# Patient Record
Sex: Female | Born: 1970 | Race: White | Hispanic: No | Marital: Married | Smoking: Current some day smoker
Health system: Southern US, Community
[De-identification: ages and names within clinical notes are randomized; demographics above are authoritative.]

## PROBLEM LIST (undated history)

## (undated) DIAGNOSIS — G43909 Migraine, unspecified, not intractable, without status migrainosus: Secondary | ICD-10-CM

## (undated) DIAGNOSIS — E079 Disorder of thyroid, unspecified: Secondary | ICD-10-CM

## (undated) DIAGNOSIS — E039 Hypothyroidism, unspecified: Secondary | ICD-10-CM

## (undated) DIAGNOSIS — T7840XA Allergy, unspecified, initial encounter: Secondary | ICD-10-CM

## (undated) DIAGNOSIS — G4730 Sleep apnea, unspecified: Secondary | ICD-10-CM

## (undated) DIAGNOSIS — M3500 Sicca syndrome, unspecified: Secondary | ICD-10-CM

## (undated) DIAGNOSIS — J449 Chronic obstructive pulmonary disease, unspecified: Secondary | ICD-10-CM

## (undated) DIAGNOSIS — J45909 Unspecified asthma, uncomplicated: Secondary | ICD-10-CM

## (undated) DIAGNOSIS — I10 Essential (primary) hypertension: Secondary | ICD-10-CM

## (undated) DIAGNOSIS — M1990 Unspecified osteoarthritis, unspecified site: Secondary | ICD-10-CM

## (undated) DIAGNOSIS — M069 Rheumatoid arthritis, unspecified: Secondary | ICD-10-CM

## (undated) DIAGNOSIS — F329 Major depressive disorder, single episode, unspecified: Secondary | ICD-10-CM

## (undated) DIAGNOSIS — R319 Hematuria, unspecified: Secondary | ICD-10-CM

## (undated) DIAGNOSIS — M329 Systemic lupus erythematosus, unspecified: Secondary | ICD-10-CM

## (undated) DIAGNOSIS — Z903 Acquired absence of stomach [part of]: Secondary | ICD-10-CM

## (undated) DIAGNOSIS — K219 Gastro-esophageal reflux disease without esophagitis: Secondary | ICD-10-CM

## (undated) DIAGNOSIS — F32A Depression, unspecified: Secondary | ICD-10-CM

## (undated) DIAGNOSIS — E890 Postprocedural hypothyroidism: Secondary | ICD-10-CM

## (undated) DIAGNOSIS — R7611 Nonspecific reaction to tuberculin skin test without active tuberculosis: Secondary | ICD-10-CM

## (undated) DIAGNOSIS — IMO0002 Reserved for concepts with insufficient information to code with codable children: Secondary | ICD-10-CM

## (undated) DIAGNOSIS — Z8669 Personal history of other diseases of the nervous system and sense organs: Secondary | ICD-10-CM

## (undated) HISTORY — PX: BREAST CYST ASPIRATION: SHX578

## (undated) HISTORY — DX: Unspecified asthma, uncomplicated: J45.909

## (undated) HISTORY — DX: Allergy, unspecified, initial encounter: T78.40XA

## (undated) HISTORY — DX: Depression, unspecified: F32.A

## (undated) HISTORY — DX: Chronic obstructive pulmonary disease, unspecified: J44.9

## (undated) HISTORY — DX: Major depressive disorder, single episode, unspecified: F32.9

## (undated) HISTORY — DX: Hematuria, unspecified: R31.9

## (undated) HISTORY — DX: Nonspecific reaction to tuberculin skin test without active tuberculosis: R76.11

## (undated) HISTORY — DX: Disorder of thyroid, unspecified: E07.9

## (undated) HISTORY — DX: Essential (primary) hypertension: I10

## (undated) HISTORY — DX: Migraine, unspecified, not intractable, without status migrainosus: G43.909

## (undated) HISTORY — DX: Gastro-esophageal reflux disease without esophagitis: K21.9

## (undated) HISTORY — PX: OOPHORECTOMY: SHX86

## (undated) HISTORY — DX: Sleep apnea, unspecified: G47.30

---

## 1988-08-05 HISTORY — PX: CHOLECYSTECTOMY: SHX55

## 1998-08-05 HISTORY — PX: TUBAL LIGATION: SHX77

## 2003-08-06 HISTORY — PX: OTHER SURGICAL HISTORY: SHX169

## 2005-09-16 ENCOUNTER — Ambulatory Visit: Payer: Self-pay

## 2006-10-02 ENCOUNTER — Ambulatory Visit: Payer: Self-pay | Admitting: Family Medicine

## 2007-06-11 ENCOUNTER — Ambulatory Visit: Payer: Self-pay

## 2007-08-06 HISTORY — PX: ANKLE SURGERY: SHX546

## 2007-08-17 ENCOUNTER — Ambulatory Visit: Payer: Self-pay | Admitting: Podiatry

## 2007-12-24 ENCOUNTER — Ambulatory Visit: Payer: Self-pay | Admitting: Podiatry

## 2009-02-01 ENCOUNTER — Other Ambulatory Visit: Payer: Self-pay

## 2009-02-16 ENCOUNTER — Ambulatory Visit: Payer: Self-pay

## 2009-02-24 ENCOUNTER — Ambulatory Visit: Payer: Self-pay

## 2009-08-21 ENCOUNTER — Ambulatory Visit: Payer: Self-pay | Admitting: Family Medicine

## 2010-01-30 ENCOUNTER — Ambulatory Visit: Payer: Self-pay

## 2010-08-04 ENCOUNTER — Ambulatory Visit: Payer: Self-pay | Admitting: Internal Medicine

## 2011-01-31 ENCOUNTER — Ambulatory Visit: Payer: Self-pay | Admitting: Obstetrics and Gynecology

## 2011-05-07 ENCOUNTER — Ambulatory Visit: Payer: Self-pay | Admitting: Internal Medicine

## 2011-05-09 ENCOUNTER — Ambulatory Visit: Payer: Self-pay | Admitting: Internal Medicine

## 2011-05-19 DIAGNOSIS — E890 Postprocedural hypothyroidism: Secondary | ICD-10-CM | POA: Insufficient documentation

## 2011-07-25 ENCOUNTER — Ambulatory Visit: Payer: Self-pay | Admitting: General Practice

## 2011-08-04 ENCOUNTER — Ambulatory Visit: Payer: Self-pay

## 2011-08-06 HISTORY — PX: BILATERAL SALPINGOOPHORECTOMY: SHX1223

## 2011-08-06 HISTORY — PX: BREAST SURGERY: SHX581

## 2011-08-06 HISTORY — PX: ABDOMINAL HYSTERECTOMY: SHX81

## 2011-09-27 ENCOUNTER — Ambulatory Visit: Payer: Self-pay | Admitting: General Practice

## 2012-01-30 ENCOUNTER — Ambulatory Visit: Payer: Self-pay | Admitting: General Practice

## 2012-07-15 ENCOUNTER — Ambulatory Visit: Payer: Self-pay | Admitting: Obstetrics and Gynecology

## 2012-07-20 ENCOUNTER — Ambulatory Visit: Payer: Self-pay | Admitting: Obstetrics and Gynecology

## 2012-07-20 LAB — BASIC METABOLIC PANEL
Calcium, Total: 8.7 mg/dL (ref 8.5–10.1)
Creatinine: 0.53 mg/dL — ABNORMAL LOW (ref 0.60–1.30)
EGFR (African American): >60
EGFR (Non-African Amer.): >60
Glucose: 100 mg/dL — ABNORMAL HIGH (ref 65–99)
Potassium: 3.6 mmol/L (ref 3.5–5.1)
Sodium: 137 mmol/L (ref 136–145)

## 2012-07-20 LAB — CBC
HGB: 12.9 g/dL (ref 12.0–16.0)
MCH: 29.7 pg (ref 26.0–34.0)
MCV: 86 fL (ref 80–100)
RBC: 4.34 10*6/uL (ref 3.80–5.20)

## 2012-07-20 LAB — PREGNANCY, URINE: Pregnancy Test, Urine: NEGATIVE m[IU]/mL

## 2012-07-22 ENCOUNTER — Ambulatory Visit: Payer: Self-pay | Admitting: Obstetrics and Gynecology

## 2012-07-23 LAB — CREATININE, SERUM
Creatinine: 0.52 mg/dL — ABNORMAL LOW (ref 0.60–1.30)
EGFR (African American): >60

## 2012-07-24 LAB — PATHOLOGY REPORT

## 2012-09-08 ENCOUNTER — Ambulatory Visit: Payer: Self-pay | Admitting: General Practice

## 2012-09-08 LAB — COMPREHENSIVE METABOLIC PANEL
Albumin: 4.1 g/dL (ref 3.4–5.0)
Alkaline Phosphatase: 103 U/L (ref 50–136)
Anion Gap: 9 (ref 7–16)
Bilirubin,Total: 0.3 mg/dL (ref 0.2–1.0)
Chloride: 102 mmol/L (ref 98–107)
Creatinine: 0.52 mg/dL — ABNORMAL LOW (ref 0.60–1.30)
EGFR (African American): >60
EGFR (Non-African Amer.): >60
Glucose: 85 mg/dL (ref 65–99)
Sodium: 137 mmol/L (ref 136–145)
Total Protein: 8.8 g/dL — ABNORMAL HIGH (ref 6.4–8.2)

## 2012-09-08 LAB — CBC WITH DIFFERENTIAL/PLATELET
Basophil #: 0.1 10*3/uL (ref 0.0–0.1)
Eosinophil #: 0.1 10*3/uL (ref 0.0–0.7)
Eosinophil %: 1.3 %
HGB: 13.7 g/dL (ref 12.0–16.0)
Lymphocyte #: 2.9 10*3/uL (ref 1.0–3.6)
MCH: 29.4 pg (ref 26.0–34.0)
MCV: 86 fL (ref 80–100)
Monocyte %: 6.2 %
Neutrophil %: 56.7 %
RDW: 13.1 % (ref 11.5–14.5)
WBC: 8.3 10*3/uL (ref 3.6–11.0)

## 2012-09-08 LAB — TSH: Thyroid Stimulating Horm: 3.03 u[IU]/mL

## 2012-11-26 ENCOUNTER — Other Ambulatory Visit: Payer: Self-pay | Admitting: Obstetrics and Gynecology

## 2012-11-26 LAB — T4, FREE: Free Thyroxine: 1.03 ng/dL (ref 0.76–1.46)

## 2012-12-11 ENCOUNTER — Encounter: Payer: Self-pay | Admitting: Adult Health

## 2012-12-11 ENCOUNTER — Ambulatory Visit (INDEPENDENT_AMBULATORY_CARE_PROVIDER_SITE_OTHER): Payer: 59 | Admitting: Adult Health

## 2012-12-11 VITALS — BP 100/62 | HR 78 | Temp 98.0°F | Resp 14 | Ht 70.5 in | Wt 231.5 lb

## 2012-12-11 DIAGNOSIS — E0500 Thyrotoxicosis with diffuse goiter without thyrotoxic crisis or storm: Secondary | ICD-10-CM

## 2012-12-11 DIAGNOSIS — G43909 Migraine, unspecified, not intractable, without status migrainosus: Secondary | ICD-10-CM

## 2012-12-11 DIAGNOSIS — Z8639 Personal history of other endocrine, nutritional and metabolic disease: Secondary | ICD-10-CM

## 2012-12-11 DIAGNOSIS — Z7189 Other specified counseling: Secondary | ICD-10-CM

## 2012-12-11 DIAGNOSIS — Z862 Personal history of diseases of the blood and blood-forming organs and certain disorders involving the immune mechanism: Secondary | ICD-10-CM

## 2012-12-11 DIAGNOSIS — F17200 Nicotine dependence, unspecified, uncomplicated: Secondary | ICD-10-CM

## 2012-12-11 DIAGNOSIS — Z716 Tobacco abuse counseling: Secondary | ICD-10-CM

## 2012-12-11 DIAGNOSIS — Z0000 Encounter for general adult medical examination without abnormal findings: Secondary | ICD-10-CM | POA: Insufficient documentation

## 2012-12-11 MED ORDER — VENLAFAXINE HCL ER 37.5 MG PO CP24: 37.5000 mg | ORAL_CAPSULE | Freq: Every day | ORAL | Status: DC

## 2012-12-11 MED ORDER — TOPIRAMATE 100 MG PO TABS: 100.0000 mg | ORAL_TABLET | Freq: Every day | ORAL | Status: DC

## 2012-12-11 NOTE — Patient Instructions (Addendum)
   Thank you for choosing [place] [facility] for your health care needs.  The results will be available through MyChart for your convenience. Please remember to activate this. The activation code is located at the end of this form.  I am referring you to our [place] Endocrinologist. The office will contact you with an appointment.  Please have your fasting labs done at [facility]. Then schedule a f/u appointment within 2-3 weeks to discuss.

## 2012-12-11 NOTE — Assessment & Plan Note (Addendum)
Has been followed by endocrine at [facility]. Due to change in insurance, patient would like referral to endocrinologist at [facility]. She is currently on Synthroid 137 mcg daily. She reports last TSH and free T4, T3 normal. Will request medical records.

## 2012-12-11 NOTE — Assessment & Plan Note (Signed)
Spent greater than 5 minutes discussing importance of smoking cessation. Patient is actively trying to cut back the amount of cigarettes that she smokes with the goal of quitting. She is aware of medication available to help with this.

## 2012-12-11 NOTE — Assessment & Plan Note (Signed)
Controlled on Topamax 100 mg daily. Continue to monitor.

## 2012-12-11 NOTE — Progress Notes (Signed)
Subjective:    Patient ID: [name], female    DOB: 1971/02/03, 42 y.o.   MRN: [id number]  HPI  Patient is a 42 y/o female who presents to clinic to establish care.  Patient was previously followed at [facility] in [facility]. Last physical was ~ 2006. She is feeling well overall.    Past Medical History  Diagnosis Date  . Asthma   . Depression   . Migraines   . GERD (gastroesophageal reflux disease)   . Allergy   . Hypertension   . Thyroid disease     Graves disease radioactive iodine 10/12  . Positive TB test     Past Surgical History  Procedure Laterality Date  . Ankle surgery  08/06/2007    left  . Ganglion cyst removal  08/06/2003    left wrist  . Tubal ligation  2000  . Abdominal hysterectomy  2013    and BSO  . Bilateral salpingoophorectomy  2013  . Breast surgery Left 2013    biopsy-benign  . Cholecystectomy  1990    Family History  Problem Relation Age of Onset  . Cancer Mother     fallopian tube  . Arthritis Mother   . Heart disease Mother   . Diabetes Mother   . Hypertension Mother   . Cancer Maternal Aunt     Breast CA  . Diabetes Maternal Grandmother   . Diabetes Maternal Grandfather   . Diabetes Paternal Grandmother   . Diabetes Paternal Grandfather     History   Social History  . Marital Status: Married    Spouse Name: N/A    Number of Children: N/A  . Years of Education: N/A   Occupational History  . Not on file.   Social History Main Topics  . Smoking status: Current Every Day Smoker -- 0.50 packs/day for 20 years    Types: Cigarettes  . Smokeless tobacco: Never Used  . Alcohol Use: No  . Drug Use: No  . Sexually Active: Yes    Birth Control/ Protection: Surgical   Other Topics Concern  . Not on file   Social History Narrative  . No narrative on file     Health Maintenance:  Tdap - 2012  Flu shot - 05/2012; Repeat 05/2013  PAP - 02/2012 All normal within 5 years. Total hysterectomy, bilateral salpingo  oophorectomy.  Mammography - 07/2012. Fibrocystic breast - [facility]. Next 07/2013.  Colonoscopy - N/A  Labs - needs routine labs: cbc w/diff, cmet, lipids. TSH recently done at Endocrinology office.  Depression Screen - hx of depression. Well controlled on Effexor. No feelings of sadness, hopelessness or anhedonia.  Tobacco Use - Current smoker 1/2 ppd. 20 year smoker.  Dental Exams - Upper dentures. Sees dentist every 6 months. Last exam 11/2012.  Vision Exam - 09/2012  Exercise - Walks 3-4 times a week.  Diet - Does not like vegetables. She will eat tomatoes and zucchini. Eats fruits. Not a meat eater. Will eat chicken.    Review of Systems  Constitutional: Positive for fatigue. Negative for fever and chills.  Eyes: Negative.   Respiratory: Negative.   Cardiovascular: Negative for chest pain, palpitations and leg swelling.  Gastrointestinal: Positive for diarrhea and constipation.  Endocrine: Positive for heat intolerance.  Genitourinary: Negative for dysuria, urgency, frequency, hematuria, vaginal bleeding, vaginal discharge, difficulty urinating, pelvic pain and dyspareunia.  Musculoskeletal: Positive for arthralgias.       Mainly at night-time  Skin: Negative.   Allergic/Immunologic: Positive  for environmental allergies. Negative for food allergies.  Neurological: Positive for headaches. Negative for dizziness, tremors, weakness, light-headedness and numbness.       Headaches controlled with topamax  Hematological: Negative.   Psychiatric/Behavioral: Negative for suicidal ideas, behavioral problems, confusion, self-injury, decreased concentration and agitation. The patient is not nervous/anxious.     BP 100/62  Pulse 78  Temp(Src) 98 F (36.7 C) (Oral)  Resp 14  Ht 5' 10.5" (1.791 m)  Wt 231 lb 8 oz (105.008 kg)  BMI 32.74 kg/m2  SpO2 97%    Objective:   Physical Exam  Constitutional: She is oriented to person, place, and time. She appears  well-developed and well-nourished. No distress.  HENT:  Head: Normocephalic and atraumatic.  Right Ear: External ear normal.  Left Ear: External ear normal.  Nose: Nose normal.  Mouth/Throat: Oropharynx is clear and moist. No oropharyngeal exudate.  Eyes: Conjunctivae and EOM are normal. Pupils are equal, round, and reactive to light.  Neck: Normal range of motion. Neck supple. No thyromegaly present.  Cardiovascular: Normal rate, regular rhythm, normal heart sounds and intact distal pulses.  Exam reveals no gallop and no friction rub.   No murmur heard. Pulmonary/Chest: Effort normal and breath sounds normal. No respiratory distress. She has no wheezes. She has no rales. She exhibits no tenderness.  Abdominal: Soft. Bowel sounds are normal. She exhibits no distension and no mass. There is no tenderness. There is no rebound and no guarding.  Musculoskeletal: Normal range of motion. She exhibits no edema and no tenderness.  Lymphadenopathy:    She has no cervical adenopathy.  Neurological: She is alert and oriented to person, place, and time. She has normal reflexes. She displays normal reflexes. No cranial nerve deficit. She exhibits normal muscle tone. Coordination normal.  Skin: Skin is warm and dry. No rash noted. No erythema. No pallor.  Psychiatric: She has a normal mood and affect. Her behavior is normal. Judgment and thought content normal.      Assessment & Plan:

## 2012-12-11 NOTE — Assessment & Plan Note (Signed)
Normal physical exam. Patient recently had thyroid function tests so I will not obtain these. Will request medical records from previous provider. Check CBC with differential, metabolic panel, lipids. Refer to [facility] endocrinology for her history of Graves' disease.

## 2012-12-16 ENCOUNTER — Encounter: Payer: Self-pay | Admitting: Internal Medicine

## 2012-12-16 ENCOUNTER — Ambulatory Visit (INDEPENDENT_AMBULATORY_CARE_PROVIDER_SITE_OTHER): Payer: 59 | Admitting: Internal Medicine

## 2012-12-16 ENCOUNTER — Other Ambulatory Visit: Payer: Self-pay

## 2012-12-16 ENCOUNTER — Telehealth: Payer: Self-pay | Admitting: Adult Health

## 2012-12-16 VITALS — BP 100/68 | HR 96 | Temp 98.6°F | Resp 12 | Ht 70.5 in | Wt 229.0 lb

## 2012-12-16 DIAGNOSIS — Z8639 Personal history of other endocrine, nutritional and metabolic disease: Secondary | ICD-10-CM

## 2012-12-16 DIAGNOSIS — Z862 Personal history of diseases of the blood and blood-forming organs and certain disorders involving the immune mechanism: Secondary | ICD-10-CM

## 2012-12-16 DIAGNOSIS — E890 Postprocedural hypothyroidism: Secondary | ICD-10-CM

## 2012-12-16 LAB — CBC WITH DIFFERENTIAL/PLATELET
Basophil #: 0.1 10*3/uL (ref 0.0–0.1)
Eosinophil %: 1.5 %
HCT: 41.8 % (ref 35.0–47.0)
Lymphocyte #: 2.4 10*3/uL (ref 1.0–3.6)
Lymphocyte %: 30 %
MCH: 29.4 pg (ref 26.0–34.0)
MCHC: 35.1 g/dL (ref 32.0–36.0)
Monocyte #: 0.6 x10 3/mm (ref 0.2–0.9)
Monocyte %: 8 %
Neutrophil #: 4.7 10*3/uL (ref 1.4–6.5)
Neutrophil %: 59.3 %
WBC: 8 10*3/uL (ref 3.6–11.0)

## 2012-12-16 LAB — COMPREHENSIVE METABOLIC PANEL
Albumin: 4.1 g/dL (ref 3.4–5.0)
Alkaline Phosphatase: 94 U/L (ref 50–136)
Anion Gap: 6 — ABNORMAL LOW (ref 7–16)
BUN: 14 mg/dL (ref 7–18)
Chloride: 107 mmol/L (ref 98–107)
Co2: 24 mmol/L (ref 21–32)
Creatinine: 0.74 mg/dL (ref 0.60–1.30)
EGFR (Non-African Amer.): >60
Glucose: 98 mg/dL (ref 65–99)
Osmolality: 274 (ref 275–301)
Potassium: 4 mmol/L (ref 3.5–5.1)
SGOT(AST): 10 U/L — ABNORMAL LOW (ref 15–37)
SGPT (ALT): 14 U/L (ref 12–78)

## 2012-12-16 LAB — LIPID PANEL
HDL Cholesterol: 28 mg/dL — ABNORMAL LOW (ref 40–60)
Triglycerides: 145 mg/dL (ref 0–200)
VLDL Cholesterol, Calc: 29 mg/dL (ref 5–40)

## 2012-12-16 MED ORDER — LEVOTHYROXINE SODIUM 137 MCG PO TABS: 137.0000 ug | ORAL_TABLET | Freq: Every day | ORAL | Status: DC

## 2012-12-16 NOTE — Patient Instructions (Signed)
Please return in 6 months.  

## 2012-12-16 NOTE — Telephone Encounter (Signed)
MyChart message regarding recent labs.

## 2012-12-16 NOTE — Progress Notes (Signed)
Subjective:     Patient ID: [name], female   DOB: [birth year], 42 y.o.   MRN: [id number]  HPI [name] is a pleasant 42 y/o woman referred by her PCP, Rey,Raquel, NP, for management of hypothyroidism post RAI ablation for Graves ds.  Pt was diagnosed with Graves' disease in 2010. She remembers displaying odd behavior just before dx. She was on MMI and Atenolol before, then had radioactive iodine ablation in 05/2011. She is now on levothyroxine 137 mcg daily. She takes this on an empty stomach, with water, separated by breakfast by more than 30 minutes. She does not take multivitamins, calcium, iron, proton pump inhibitors along with it.  She was seen at [facility] clinic by endocrinology (Dr. Renae Fickle), and her thyroid function tests were normal andin 07/2012. She also brings lab work from 11/26/2012: TSH 3.53 and free T4 1.03.   She also has a history of migraines, asthma, depression, hypertension, GERD. She had TAH/BSO in 07/2012. Gained 15 lbs in last 3 months, also has fatigue, constipation. She has occasional Palpitations >> was investigated recently by Holter >> PVCs.   FH of thyroid ds in mother >> had thyroidectomy (overactive) at 14-8 y/o, also in [place]  Review of Systems Constitutional: + weight gain, + fatigue, + subjective hyperthermia Eyes: no blurry vision, no xerophthalmia ENT: no sore throat, no nodules palpated in throat, no dysphagia/odynophagia, no hoarseness Cardiovascular: no CP/SOB/+palpitations/ no leg swelling Respiratory: no cough/SOB Gastrointestinal: no N/V/D/+, + gerdC Musculoskeletal: + both muscle/joint aches Skin: no rashes Neurological: no tremors/numbness/tingling/dizziness Psychiatric: no depression/anxiety  Past Medical History  Diagnosis Date  . Asthma   . Depression   . Migraines   . GERD (gastroesophageal reflux disease)   . Allergy   . Hypertension   . Thyroid disease     Graves disease radioactive iodine 10/12  . Positive TB test    Past  Surgical History  Procedure Laterality Date  . Ankle surgery  08/06/2007    left  . Ganglion cyst removal  08/06/2003    left wrist  . Tubal ligation  2000  . Abdominal hysterectomy  2013    and BSO  . Bilateral salpingoophorectomy  2013  . Breast surgery Left 2013    biopsy-benign  . Cholecystectomy  1990   History   Social History  . Marital Status: Married    Spouse Name: N/A    Number of Children: 1, 29 y/o   Occupational History  . Not on file.   Social History Main Topics  . Smoking status: Current Every Day Smoker -- 0.50 packs/day for 20 years. She smokes 4 packs per week, used to smoke 2 PPD.     Types: Cigarettes  . Smokeless tobacco: Never Used  . Alcohol Use: Wine, seldom  . Drug Use: No  . Sexually Active: Yes    Birth Control/ Protection: Surgical   Current Outpatient Prescriptions on File Prior to Visit  Medication Sig Dispense Refill  . estrogen-methylTESTOSTERone 0.625-1.25 MG per tablet Take 1 tablet by mouth daily.      Marland Kitchen topiramate (TOPAMAX) 100 MG tablet Take 1 tablet (100 mg total) by mouth daily.  90 tablet  5  . venlafaxine XR (EFFEXOR-XR) 37.5 MG 24 hr capsule Take 1 capsule (37.5 mg total) by mouth daily.  90 capsule  5   No current facility-administered medications on file prior to visit.   Allergies  Allergen Reactions  . Inh (Isoniazid) Swelling   Family History  Problem Relation Age of  Onset  . Cancer Mother     fallopian tube  . Arthritis Mother   . Heart disease Mother   . Diabetes Mother   . Hypertension Mother   . Cancer Maternal Aunt     Breast CA  . Diabetes Maternal Grandmother   . Diabetes Maternal Grandfather   . Diabetes Paternal Grandmother   . Diabetes Paternal Grandfather     Objective:   Physical Exam BP 100/68  Pulse 96  Temp(Src) 98.6 F (37 C) (Oral)  Resp 12  Ht 5' 10.5" (1.791 m)  Wt 229 lb (103.874 kg)  BMI 32.38 kg/m2  SpO2 95% Wt Readings from Last 3 Encounters:  12/16/12 229 lb (103.874 kg)   12/11/12 231 lb 8 oz (105.008 kg)   Constitutional: overweight, in NAD Eyes: PERRLA, EOMI, no exophthalmos ENT: moist mucous membranes, no thyromegaly, no cervical lymphadenopathy Cardiovascular: RRR, No MRG Respiratory: CTA B Gastrointestinal: abdomen soft, NT, ND, BS+ Musculoskeletal: no deformities, strength intact in all 4 Skin: moist, warm, no rashes Neurological: no tremor with outstretched hands, DTR normal in all 4    Assessment:     1. Hypothyroidism - post RAI tx for Graves ds in 05/2011     Plan:     Patient with history of Graves disease, status post radioactive iodine ablation a year and a half ago, which appears to have been ineffective. She is on levothyroxine, which she takes correctly separated from other meds and from breakfast. She is not on PPI calcium or iron. - The latest thyroid tests checked approximately 2 weeks ago were normal, so we should continue the same dose of levothyroxine of 137 mcg daily. I sent the prescription to her pharmacy.  - Patient has a constellation of symptoms that can be suggestive of hypothyroidism, however, in the context of normal thyroid tests, and these are more likely caused by her recent hysterectomy. We had a long discussion about consequences of surgical menopause. She is on estrogen/testosterone, started by her OB/GYN doctor. The testosterone was added for low libido. - Since patient is complaining of constipation, and also has had palpitations, I suggested that she tries to take magnesium supplement, which will help with both. I advised her to start with 200 or 250 mg daily, and increase to double as needed. - I will see the patient back in 6 months, but I advised her to let me know if she develops hyper- or hypothyroid symptoms

## 2012-12-21 ENCOUNTER — Emergency Department: Payer: Self-pay | Admitting: Emergency Medicine

## 2012-12-31 ENCOUNTER — Encounter: Payer: Self-pay | Admitting: General Practice

## 2013-01-03 ENCOUNTER — Encounter: Payer: Self-pay | Admitting: General Practice

## 2013-01-20 ENCOUNTER — Ambulatory Visit: Payer: Self-pay | Admitting: General Practice

## 2013-02-02 ENCOUNTER — Encounter: Payer: Self-pay | Admitting: General Practice

## 2013-02-10 ENCOUNTER — Encounter: Payer: Self-pay | Admitting: Adult Health

## 2013-02-10 ENCOUNTER — Encounter: Payer: Self-pay | Admitting: Internal Medicine

## 2013-02-10 ENCOUNTER — Other Ambulatory Visit: Payer: Self-pay | Admitting: Internal Medicine

## 2013-02-10 ENCOUNTER — Other Ambulatory Visit: Payer: Self-pay | Admitting: Adult Health

## 2013-02-10 DIAGNOSIS — E890 Postprocedural hypothyroidism: Secondary | ICD-10-CM

## 2013-02-10 DIAGNOSIS — Z8639 Personal history of other endocrine, nutritional and metabolic disease: Secondary | ICD-10-CM

## 2013-02-10 MED ORDER — PANTOPRAZOLE SODIUM 20 MG PO TBEC: 20.0000 mg | DELAYED_RELEASE_TABLET | Freq: Every day | ORAL | Status: DC

## 2013-02-11 ENCOUNTER — Other Ambulatory Visit: Payer: Self-pay | Admitting: Internal Medicine

## 2013-02-11 LAB — TSH: Thyroid Stimulating Horm: 5.66 u[IU]/mL — ABNORMAL HIGH

## 2013-02-23 ENCOUNTER — Encounter: Payer: Self-pay | Admitting: Internal Medicine

## 2013-02-23 ENCOUNTER — Other Ambulatory Visit: Payer: Self-pay | Admitting: Internal Medicine

## 2013-02-23 DIAGNOSIS — E890 Postprocedural hypothyroidism: Secondary | ICD-10-CM

## 2013-02-23 MED ORDER — LEVOTHYROXINE SODIUM 150 MCG PO TABS: 150.0000 ug | ORAL_TABLET | Freq: Every day | ORAL | Status: DC

## 2013-02-23 NOTE — Telephone Encounter (Signed)
Pt is requesting lab results.

## 2013-03-05 ENCOUNTER — Encounter: Payer: Self-pay | Admitting: General Practice

## 2013-03-10 ENCOUNTER — Encounter: Payer: Self-pay | Admitting: Internal Medicine

## 2013-04-05 ENCOUNTER — Encounter: Payer: Self-pay | Admitting: General Practice

## 2013-05-05 ENCOUNTER — Ambulatory Visit: Payer: Self-pay | Admitting: Podiatry

## 2013-06-10 ENCOUNTER — Other Ambulatory Visit: Payer: Self-pay

## 2013-06-23 ENCOUNTER — Ambulatory Visit (INDEPENDENT_AMBULATORY_CARE_PROVIDER_SITE_OTHER): Payer: 59 | Admitting: Internal Medicine

## 2013-06-23 ENCOUNTER — Encounter: Payer: Self-pay | Admitting: Internal Medicine

## 2013-06-23 VITALS — BP 118/72 | HR 100 | Temp 98.3°F | Resp 10 | Wt 247.1 lb

## 2013-06-23 DIAGNOSIS — Z862 Personal history of diseases of the blood and blood-forming organs and certain disorders involving the immune mechanism: Secondary | ICD-10-CM

## 2013-06-23 DIAGNOSIS — Z8639 Personal history of other endocrine, nutritional and metabolic disease: Secondary | ICD-10-CM

## 2013-06-23 NOTE — Patient Instructions (Signed)
Please check a TSH and fT4 at work. Please come back for a follow-up appointment in 6 months.

## 2013-06-23 NOTE — Progress Notes (Signed)
Subjective:     Patient ID: [name], female   DOB: May 05, 1971, 42 y.o.   MRN: [id number]  HPI Ms Manzer is a pleasant 42 y.o. woman returning for f/u for hypothyroidism post RAI ablation for Graves ds.  She fell 12/21/2012 >> left talar fx >> had surgery >> was in a cast >> now in the boot x 4 weeks. Just had gone back to work. She has gained weight ~8 lbs and also sleeping more, but she was almost on bedrest.   Reviewed hx: Pt was diagnosed with Graves' disease in 2010. She was on MMI and Atenolol before, then had radioactive iodine ablation in 05/2011.   She takes this on an empty stomach, with water, separated by breakfast by more than 30 minutes. She does not take multivitamins, calcium, iron, proton pump inhibitors along with it. However, she started Protonix since last visit >> TSH higher at last check >> we increase levothyroxine to 150 mcg.   - 02/11/2013: TSH 5.66, fT4 0.89 >> increased the generic Levothyroxine to 150 mcg. Before this, she was stared Protonix 20 (at night) - 11/26/2012: TSH 3.53 and free T4 1.03. - 07/2012: her thyroid function tests were normal [facility] clinic, Dr Renae Fickle)   She also has a history of migraines, asthma, depression, hypertension, GERD. She had TAH/BSO in 07/2012. She has occasional Palpitations >> Holter >> PVCs. I suggested she tried Mg at last visit and she started it but only stayed at 200 mg daily. Palpitations a little better but not resolved.  Review of Systems Constitutional: + weight gain, + fatigue, no subjective hyperthermia/hypothyroidism Eyes: no blurry vision, no xerophthalmia ENT: no sore throat, no nodules palpated in throat, no dysphagia/odynophagia, no hoarseness Cardiovascular: no CP/SOB/+palpitations/ no leg swelling Respiratory: no cough/SOB Gastrointestinal: no N/V/D/+heartburn, +C Musculoskeletal: + both muscle/joint aches Skin: no rashes Neurological: no tremors/numbness/tingling/dizziness  I reviewed pt's  medications, allergies, PMH, social hx, family hx and no changes required, except as mentioned above.  Objective:   Physical Exam BP 118/72  Pulse 100  Temp(Src) 98.3 F (36.8 C) (Oral)  Resp 10  Wt 247 lb 1.9 oz (112.093 kg)  SpO2 95% Wt Readings from Last 3 Encounters:  06/23/13 247 lb 1.9 oz (112.093 kg)  12/16/12 229 lb (103.874 kg)  12/11/12 231 lb 8 oz (105.008 kg)   Constitutional: overweight, in NAD Eyes: PERRLA, EOMI, no exophthalmos ENT: moist mucous membranes, no thyromegaly, no cervical lymphadenopathy Cardiovascular: RRR, No MRG Respiratory: CTA B Gastrointestinal: abdomen soft, NT, ND, BS+ Musculoskeletal: no deformities, left foot in boot - crutches Skin: moist, warm, no rashes Neurological: no tremor with outstretched hands, DTR normal in all 4    Assessment:     1. Hypothyroidism - post RAI tx for Graves ds in 05/2011     Plan:     Patient with history of Graves disease, status post radioactive iodine ablation which appears to have been ineffective.  - She is on levothyroxine 150, which she takes correctly separated from other meds and from breakfast. She is on PPIs at night, calcium or iron. - she has had weight gain and fatigue, but she is non-weightbearing 2/2 her fracture so this is to be expected - will check TFTs  - per pt preference, she will have them checked at work - I will see the patient back in 6 months, but I advised her to let me know if she develops hyper- or hypothyroid symptoms   07/06/2013 Labs still pending. I will addend the  results when they become available.

## 2013-06-24 ENCOUNTER — Other Ambulatory Visit: Payer: Self-pay | Admitting: Internal Medicine

## 2013-06-24 LAB — TSH: Thyroid Stimulating Horm: 4.63 u[IU]/mL — ABNORMAL HIGH

## 2013-06-24 LAB — T4, FREE: Free Thyroxine: 1.13 ng/dL (ref 0.76–1.46)

## 2013-06-28 ENCOUNTER — Encounter: Payer: Self-pay | Admitting: General Practice

## 2013-07-05 ENCOUNTER — Encounter: Payer: Self-pay | Admitting: General Practice

## 2013-07-19 ENCOUNTER — Ambulatory Visit: Payer: Self-pay | Admitting: Obstetrics and Gynecology

## 2013-08-05 ENCOUNTER — Encounter: Payer: Self-pay | Admitting: General Practice

## 2013-08-12 ENCOUNTER — Ambulatory Visit (INDEPENDENT_AMBULATORY_CARE_PROVIDER_SITE_OTHER): Payer: 59 | Admitting: Adult Health

## 2013-08-12 ENCOUNTER — Encounter: Payer: Self-pay | Admitting: Adult Health

## 2013-08-12 VITALS — BP 120/76 | HR 94 | Resp 12 | Wt 242.5 lb

## 2013-08-12 DIAGNOSIS — E890 Postprocedural hypothyroidism: Secondary | ICD-10-CM

## 2013-08-12 DIAGNOSIS — J329 Chronic sinusitis, unspecified: Secondary | ICD-10-CM | POA: Insufficient documentation

## 2013-08-12 DIAGNOSIS — K219 Gastro-esophageal reflux disease without esophagitis: Secondary | ICD-10-CM

## 2013-08-12 DIAGNOSIS — Z862 Personal history of diseases of the blood and blood-forming organs and certain disorders involving the immune mechanism: Secondary | ICD-10-CM

## 2013-08-12 DIAGNOSIS — Z8639 Personal history of other endocrine, nutritional and metabolic disease: Secondary | ICD-10-CM

## 2013-08-12 MED ORDER — FLUCONAZOLE 150 MG PO TABS: 150.0000 mg | ORAL_TABLET | Freq: Once | ORAL | Status: DC

## 2013-08-12 MED ORDER — ALBUTEROL SULFATE HFA 108 (90 BASE) MCG/ACT IN AERS: 1.0000 | INHALATION_SPRAY | Freq: Four times a day (QID) | RESPIRATORY_TRACT | Status: DC | PRN

## 2013-08-12 MED ORDER — AMOXICILLIN-POT CLAVULANATE 875-125 MG PO TABS: 1.0000 | ORAL_TABLET | Freq: Two times a day (BID) | ORAL | Status: DC

## 2013-08-12 MED ORDER — PANTOPRAZOLE SODIUM 20 MG PO TBEC: 20.0000 mg | DELAYED_RELEASE_TABLET | Freq: Two times a day (BID) | ORAL | Status: DC

## 2013-08-12 NOTE — Patient Instructions (Addendum)
  Start Augmentin 1 tablet twice a day for 10 days.  Start Protonix 20 mg twice a day.  Drink plenty of fluids to stay hydrated.  Please call if her symptoms are not improved within 4-5 days.

## 2013-08-12 NOTE — Assessment & Plan Note (Signed)
Start Augmentin twice a day x10 days. Return to clinic if no improvement within 4-5 day

## 2013-08-12 NOTE — Progress Notes (Signed)
Pre visit review using our clinic review tool, if applicable. No additional management support is needed unless otherwise documented below in the visit note. 

## 2013-08-12 NOTE — Assessment & Plan Note (Signed)
Increase Protonix to twice a day. If no improvement consider referral to GI

## 2013-08-12 NOTE — Progress Notes (Signed)
   Subjective:    Patient ID: [name], female    DOB: 09-14-1970, 43 y.o.   MRN: [id number]  HPI Patient is a pleasant 43 year old female who presents to clinic with the following concerns:  1. Increasing symptoms of GERD. She reports taking Protonix 20 mg daily; however, her symptoms are worsening. She has been taking TUMS as needed. She reports waking up in the middle of the night with burning in her throat.  2. Sinusitis-symptoms ongoing since Christmas. She reports fever, increased secretions that are green colored, wheezing especially at night.  3. She reports increasing fatigue. Is attributing it to being sick since Christmas but also wondering if it is somewhat related to her thyroid. She last saw endocrinology. She reports that she had blood work done at [facility]; however, is not in the patient's chart. That Dr. Renne Crigler had scheduled TSH draw in November.   Current Outpatient Prescriptions on File Prior to Visit  Medication Sig Dispense Refill  . estrogen-methylTESTOSTERone 0.625-1.25 MG per tablet Take 1 tablet by mouth daily.      Marland Kitchen levothyroxine (SYNTHROID, LEVOTHROID) 150 MCG tablet Take 1 tablet (150 mcg total) by mouth daily.  90 tablet  3  . topiramate (TOPAMAX) 100 MG tablet Take 1 tablet (100 mg total) by mouth daily.  90 tablet  5  . venlafaxine XR (EFFEXOR-XR) 37.5 MG 24 hr capsule Take 1 capsule (37.5 mg total) by mouth daily.  90 capsule  5   No current facility-administered medications on file prior to visit.      Review of Systems  Constitutional: Positive for fever, chills and fatigue.  HENT: Positive for congestion, postnasal drip, rhinorrhea, sinus pressure and sore throat.   Respiratory: Positive for cough, shortness of breath and wheezing.   Gastrointestinal:       Increasing symptoms of GERD especially at nighttime       Objective:   Physical Exam  Constitutional: She is oriented to person, place, and time.  Pleasant 43 year old female who appears  acutely.  HENT:  Head: Normocephalic and atraumatic.  Right Ear: External ear normal.  Left Ear: External ear normal.  Drainage noted posterior pharynx. There is erythema. No exudate  Cardiovascular: Normal rate, regular rhythm and normal heart sounds.  Exam reveals no gallop.   No murmur heard. Pulmonary/Chest: Effort normal. No respiratory distress. She has no wheezes. She has no rales.  Abdominal: Soft. Bowel sounds are normal. She exhibits no distension and no mass. There is no tenderness. There is no rebound and no guarding.  Musculoskeletal: Normal range of motion.  Neurological: She is alert and oriented to person, place, and time.  Psychiatric: She has a normal mood and affect. Her behavior is normal. Judgment and thought content normal.          Assessment & Plan:

## 2013-08-13 LAB — TSH: TSH: 2.75 u[IU]/mL (ref 0.35–5.50)

## 2013-08-25 ENCOUNTER — Encounter: Payer: Self-pay | Admitting: Adult Health

## 2013-08-25 ENCOUNTER — Ambulatory Visit (INDEPENDENT_AMBULATORY_CARE_PROVIDER_SITE_OTHER): Payer: 59 | Admitting: Adult Health

## 2013-08-25 VITALS — BP 112/70 | HR 95 | Temp 98.0°F | Resp 12 | Wt 244.5 lb

## 2013-08-25 DIAGNOSIS — J329 Chronic sinusitis, unspecified: Secondary | ICD-10-CM

## 2013-08-25 DIAGNOSIS — H8110 Benign paroxysmal vertigo, unspecified ear: Secondary | ICD-10-CM

## 2013-08-25 MED ORDER — CEFUROXIME AXETIL 250 MG PO TABS: 250.0000 mg | ORAL_TABLET | Freq: Two times a day (BID) | ORAL | Status: DC

## 2013-08-25 MED ORDER — ANTIPYRINE-BENZOCAINE 5.4-1.4 % OT SOLN: 3.0000 [drp] | OTIC | Status: DC | PRN

## 2013-08-25 NOTE — Progress Notes (Signed)
Pre visit review using our clinic review tool, if applicable. No additional management support is needed unless otherwise documented below in the visit note. 

## 2013-08-25 NOTE — Patient Instructions (Signed)
  Ceftin 250 mg twice a day for 10 days  Try Afrin nasal spray every 12 hours for 3 days only.  Apply a eardrops (Auralgan) 2-3 drops in the affected ear every 2 hours as needed for pain.  If your symptoms do not improve after this antibiotic, I will refer you to ENT.   Below is information on benign positional vertigo. If the symptoms do not improve within a week or 2 week and refer you to physical therapy for treatment.   Benign Positional Vertigo Vertigo means you feel like you or your surroundings are moving when they are not. Benign positional vertigo is the most common form of vertigo. Benign means that the cause of your condition is not serious. Benign positional vertigo is more common in older adults. CAUSES  Benign positional vertigo is the result of an upset in the labyrinth system. This is an area in the middle ear that helps control your balance. This may be caused by a viral infection, head injury, or repetitive motion. However, often no specific cause is found. SYMPTOMS  Symptoms of benign positional vertigo occur when you move your head or eyes in different directions. Some of the symptoms may include:  Loss of balance and falls.  Vomiting.  Blurred vision.  Dizziness.  Nausea.  Involuntary eye movements (nystagmus). DIAGNOSIS  Benign positional vertigo is usually diagnosed by physical exam. If the specific cause of your benign positional vertigo is unknown, your caregiver may perform imaging tests, such as magnetic resonance imaging (MRI) or computed tomography (CT). TREATMENT  Your caregiver may recommend movements or procedures to correct the benign positional vertigo. Medicines such as meclizine, benzodiazepines, and medicines for nausea may be used to treat your symptoms. In rare cases, if your symptoms are caused by certain conditions that affect the inner ear, you may need surgery. HOME CARE INSTRUCTIONS   Follow your caregiver's instructions.  Move slowly.  Do not make sudden body or head movements.  Avoid driving.  Avoid operating heavy machinery.  Avoid performing any tasks that would be dangerous to you or others during a vertigo episode.  Drink enough fluids to keep your urine clear or pale yellow. SEEK IMMEDIATE MEDICAL CARE IF:   You develop problems with walking, weakness, numbness, or using your arms, hands, or legs.  You have difficulty speaking.  You develop severe headaches.  Your nausea or vomiting continues or gets worse.  You develop visual changes.  Your family or friends notice any behavioral changes.  Your condition gets worse.  You have a fever.  You develop a stiff neck or sensitivity to light. MAKE SURE YOU:   Understand these instructions.  Will watch your condition.  Will get help right away if you are not doing well or get worse. Document Released: 04/29/2006 Document Revised: 10/14/2011 Document Reviewed: 04/11/2011 [facility] Patient Information 2014 [place].

## 2013-08-25 NOTE — Assessment & Plan Note (Signed)
Ongoing symptoms despite completing Augmentin. Start Ceftin. Afrin x 3 days. If no improvement with this antibiotic will refer to ENT.

## 2013-08-25 NOTE — Assessment & Plan Note (Signed)
Dizziness (room spinning) with quick movements. Offered reassurance that most episodes are transient. May try over the counter bonine or dramamine. If no improvement will refer to PT

## 2013-08-25 NOTE — Progress Notes (Signed)
   Subjective:    Patient ID: [name], female    DOB: May 18, 1971, 43 y.o.   MRN: [id number]  HPI  Patient is a pleasant 43 year old female who presents to clinic with ongoing problems with sinusitis. She now is also experiencing vertigo. She completed her course of Augmentin. She reports still having itching and tenderness in the right ear. Denies fever or chills.   Current Outpatient Prescriptions on File Prior to Visit  Medication Sig Dispense Refill  . albuterol (PROVENTIL HFA;VENTOLIN HFA) 108 (90 BASE) MCG/ACT inhaler Inhale 1-2 puffs into the lungs every 6 (six) hours as needed for wheezing or shortness of breath.  1 Inhaler  4  . estrogen-methylTESTOSTERone 0.625-1.25 MG per tablet Take 1 tablet by mouth daily.      Marland Kitchen levothyroxine (SYNTHROID, LEVOTHROID) 150 MCG tablet Take 1 tablet (150 mcg total) by mouth daily.  90 tablet  3  . pantoprazole (PROTONIX) 20 MG tablet Take 1 tablet (20 mg total) by mouth 2 (two) times daily.  60 tablet  5  . topiramate (TOPAMAX) 100 MG tablet Take 1 tablet (100 mg total) by mouth daily.  90 tablet  5  . venlafaxine XR (EFFEXOR-XR) 37.5 MG 24 hr capsule Take 1 capsule (37.5 mg total) by mouth daily.  90 capsule  5   No current facility-administered medications on file prior to visit.     Review of Systems  Constitutional: Negative for fever and chills.  HENT: Positive for congestion and ear pain. Negative for ear discharge.   Respiratory: Positive for wheezing.   Cardiovascular: Negative.   Genitourinary: Negative.   Musculoskeletal: Negative.   Neurological: Positive for dizziness.       Dizziness with changing positions and moving head quickly  Psychiatric/Behavioral: Negative.        Objective:   Physical Exam  Constitutional: She is oriented to person, place, and time. She appears well-developed and well-nourished. No distress.  HENT:  Head: Normocephalic and atraumatic.  Right ear canal with slight erythema. There appears to  be a very small polyp in the canal of bil ears.  Cardiovascular: Normal rate, regular rhythm and normal heart sounds.  Exam reveals no gallop.   No murmur heard. Pulmonary/Chest: Effort normal and breath sounds normal. No respiratory distress. She has no wheezes. She has no rales.  Musculoskeletal: Normal range of motion.  Lymphadenopathy:    She has no cervical adenopathy.  Neurological: She is alert and oriented to person, place, and time. She has normal reflexes.  Skin: Skin is warm and dry.  Psychiatric: She has a normal mood and affect. Her behavior is normal. Judgment and thought content normal.   BP 112/70  Pulse 95  Temp(Src) 98 F (36.7 C) (Oral)  Resp 12  Wt 244 lb 8 oz (110.904 kg)  SpO2 98%        Assessment & Plan:

## 2013-08-31 LAB — HM PAP SMEAR: HM PAP: NEGATIVE

## 2013-09-05 ENCOUNTER — Encounter: Payer: Self-pay | Admitting: General Practice

## 2013-09-07 ENCOUNTER — Encounter: Payer: Self-pay | Admitting: Adult Health

## 2013-09-07 ENCOUNTER — Other Ambulatory Visit: Payer: Self-pay | Admitting: Adult Health

## 2013-09-07 DIAGNOSIS — R42 Dizziness and giddiness: Secondary | ICD-10-CM

## 2013-09-17 ENCOUNTER — Encounter: Payer: Self-pay | Admitting: Adult Health

## 2013-09-22 ENCOUNTER — Ambulatory Visit: Payer: Self-pay | Admitting: Unknown Physician Specialty

## 2013-10-03 ENCOUNTER — Encounter: Payer: Self-pay | Admitting: Adult Health

## 2013-10-27 ENCOUNTER — Ambulatory Visit: Payer: Self-pay | Admitting: Neurology

## 2013-11-19 ENCOUNTER — Ambulatory Visit: Payer: Self-pay | Admitting: Neurology

## 2013-11-24 ENCOUNTER — Encounter: Payer: Self-pay | Admitting: Internal Medicine

## 2013-11-24 ENCOUNTER — Ambulatory Visit (INDEPENDENT_AMBULATORY_CARE_PROVIDER_SITE_OTHER): Payer: 59 | Admitting: Internal Medicine

## 2013-11-24 VITALS — BP 114/62 | HR 99 | Temp 97.4°F | Resp 12 | Wt 242.0 lb

## 2013-11-24 DIAGNOSIS — Z8639 Personal history of other endocrine, nutritional and metabolic disease: Secondary | ICD-10-CM

## 2013-11-24 DIAGNOSIS — Z862 Personal history of diseases of the blood and blood-forming organs and certain disorders involving the immune mechanism: Secondary | ICD-10-CM

## 2013-11-24 LAB — TSH: TSH: 1.62 u[IU]/mL (ref 0.35–5.50)

## 2013-11-24 LAB — T4, FREE: Free T4: 0.96 ng/dL (ref 0.60–1.60)

## 2013-11-24 NOTE — Patient Instructions (Signed)
Please stop at the lab.  Please return in 1 year.  

## 2013-11-24 NOTE — Progress Notes (Signed)
Subjective:     Patient ID: [name], female   DOB: [birth year], 43 y.o.   MRN: [id number]  HPI [name] is a pleasant 43 y.o. woman returning for f/u for hypothyroidism post RAI ablation for Graves ds. Last visit 5 mo ago.  She was dx'ed with OSA >> will get a Cpap soon.  Reviewed hx: Pt was diagnosed with Graves' disease in 2010. She was on MMI and Atenolol before, then had radioactive iodine ablation in 05/2011.   She takesLevothyroxine 150 mcg. on an empty stomach, with water, separated by breakfast by more than 30 minutes. She does not take multivitamins, calcium, iron, proton pump inhibitors along with it. She takes Protonix 40 mg at 6 pm.   Reviewed previous TFTs: Lab Results  Component Value Date   TSH 2.75 08/12/2013   - 02/11/2013: TSH 5.66, fT4 0.89 >> increased the generic Levothyroxine to 150 mcg. Before this, she was stared Protonix 20 (at night) - 11/26/2012: TSH 3.53 and free T4 1.03. - 07/2012: her thyroid function tests were normal [facility], Dr Eddie Dibbles)   She also has a history of migraines, asthma, depression, hypertension, GERD. She had TAH/BSO in 07/2012. She had occasional Palpitations >> Holter >> PVCs.   Review of Systems Constitutional: no weight loss/gain, + fatigue, no subjective hyperthermia/hypothyroidism Eyes: + blurry vision, no xerophthalmia ENT: no sore throat, no nodules palpated in throat, no dysphagia/odynophagia, no hoarseness Cardiovascular: no CP/SOB/palpitations/ no leg swelling Respiratory: no cough/SOB Gastrointestinal: no N/V/D/C/+heartburn Musculoskeletal: + both muscle/joint aches, HA Skin: no rashes Neurological: no tremors/numbness/tingling/dizziness  I reviewed pt's medications, allergies, PMH, social hx, family hx and no changes required, except as mentioned above. She started Magnesium and Imitrex. She Stopped Smoking!!!  Objective:   Physical Exam BP 114/62  Pulse 99  Temp(Src) 97.4 F (36.3 C) (Oral)  Resp 12   Wt 242 lb (109.77 kg)  SpO2 97% Wt Readings from Last 3 Encounters:  11/24/13 242 lb (109.77 kg)  08/25/13 244 lb 8 oz (110.904 kg)  08/12/13 242 lb 8 oz (109.997 kg)   Constitutional: overweight, in NAD Eyes: PERRLA, EOMI, no exophthalmos ENT: moist mucous membranes, no thyromegaly, no cervical lymphadenopathy Cardiovascular: RRR, No MRG Respiratory: CTA B Gastrointestinal: abdomen soft, NT, ND, BS+ Musculoskeletal: no deformities Skin: moist, warm, no rashes Neurological: no tremor with outstretched hands, DTR normal in all 4    Assessment:     1. Hypothyroidism - post RAI tx for Graves ds in 05/2011     Plan:     Patient with history of Graves disease, status post radioactive iodine ablation.  - She is on levothyroxine 150, which she takes correctly separated from other meds and from breakfast. She is on PPIs at night, no calcium or iron. - she appears euthyroid - will check TFTs today - I will see the patient back in 1 year, but I advised her to let me know if she develops hyper- or hypothyroid symptoms   Office Visit on 11/24/2013  Component Date Value Ref Range Status  . TSH 11/24/2013 1.62  0.35 - 5.50 uIU/mL Final  . Free T4 11/24/2013 0.96  0.60 - 1.60 ng/dL Final   Excellent TFTs.

## 2013-12-17 ENCOUNTER — Ambulatory Visit (INDEPENDENT_AMBULATORY_CARE_PROVIDER_SITE_OTHER): Payer: 59 | Admitting: Adult Health

## 2013-12-17 ENCOUNTER — Encounter: Payer: Self-pay | Admitting: Adult Health

## 2013-12-17 VITALS — BP 110/74 | HR 87 | Temp 98.3°F | Resp 12 | Ht 70.0 in | Wt 238.5 lb

## 2013-12-17 DIAGNOSIS — Z1239 Encounter for other screening for malignant neoplasm of breast: Secondary | ICD-10-CM

## 2013-12-17 DIAGNOSIS — Z0000 Encounter for general adult medical examination without abnormal findings: Secondary | ICD-10-CM

## 2013-12-17 DIAGNOSIS — R200 Anesthesia of skin: Secondary | ICD-10-CM

## 2013-12-17 DIAGNOSIS — R209 Unspecified disturbances of skin sensation: Secondary | ICD-10-CM

## 2013-12-17 DIAGNOSIS — R202 Paresthesia of skin: Secondary | ICD-10-CM

## 2013-12-17 LAB — COMPREHENSIVE METABOLIC PANEL
ALK PHOS: 82 U/L (ref 39–117)
ALT: 16 U/L (ref 0–35)
AST: 14 U/L (ref 0–37)
Albumin: 4.2 g/dL (ref 3.5–5.2)
BUN: 10 mg/dL (ref 6–23)
CALCIUM: 9.1 mg/dL (ref 8.4–10.5)
CO2: 26 mEq/L (ref 19–32)
Chloride: 107 mEq/L (ref 96–112)
Creat: 0.65 mg/dL (ref 0.50–1.10)
Glucose, Bld: 86 mg/dL (ref 70–99)
POTASSIUM: 4 meq/L (ref 3.5–5.3)
SODIUM: 137 meq/L (ref 135–145)
TOTAL PROTEIN: 8 g/dL (ref 6.0–8.3)
Total Bilirubin: 0.3 mg/dL (ref 0.2–1.2)

## 2013-12-17 LAB — CBC WITH DIFFERENTIAL/PLATELET
Basophils Absolute: 0.1 10*3/uL (ref 0.0–0.1)
Basophils Relative: 1 % (ref 0–1)
EOS PCT: 1 % (ref 0–5)
Eosinophils Absolute: 0.1 10*3/uL (ref 0.0–0.7)
HCT: 39.7 % (ref 36.0–46.0)
Hemoglobin: 13.7 g/dL (ref 12.0–15.0)
LYMPHS ABS: 3.2 10*3/uL (ref 0.7–4.0)
Lymphocytes Relative: 41 % (ref 12–46)
MCH: 28.5 pg (ref 26.0–34.0)
MCHC: 34.5 g/dL (ref 30.0–36.0)
MCV: 82.5 fL (ref 78.0–100.0)
Monocytes Absolute: 0.5 10*3/uL (ref 0.1–1.0)
Monocytes Relative: 6 % (ref 3–12)
Neutro Abs: 4 10*3/uL (ref 1.7–7.7)
Neutrophils Relative %: 51 % (ref 43–77)
PLATELETS: 353 10*3/uL (ref 150–400)
RBC: 4.81 MIL/uL (ref 3.87–5.11)
RDW: 14.2 % (ref 11.5–15.5)
WBC: 7.8 10*3/uL (ref 4.0–10.5)

## 2013-12-17 LAB — IRON: Iron: 61 ug/dL (ref 42–145)

## 2013-12-17 MED ORDER — PANTOPRAZOLE SODIUM 20 MG PO TBEC: 20.0000 mg | DELAYED_RELEASE_TABLET | Freq: Two times a day (BID) | ORAL | Status: DC

## 2013-12-17 NOTE — Progress Notes (Signed)
Pre visit review using our clinic review tool, if applicable. No additional management support is needed unless otherwise documented below in the visit note. 

## 2013-12-17 NOTE — Progress Notes (Signed)
Patient ID: [name], female   DOB: 06-05-1971, 43 y.o.   MRN: [id number]   Subjective:    Patient ID: [name], female    DOB: 06/21/1971, 43 y.o.   MRN: [id number]  HPI  [name] is a pleasant 43 y/o female who presents to her wellness exam. She has concerns about lower extremity pain, numbness and tingling that wake her and interfere with sleep. She is wondering if she has a vitamin deficiency. Some fatigue reported. Difficulty with weight loss despite exercising and watching portions. Otherwise, [name] is feeling well. No other concerns. PAP done in February.    Past Medical History  Diagnosis Date  . Asthma   . Depression   . Migraines   . GERD (gastroesophageal reflux disease)   . Allergy   . Hypertension   . Thyroid disease     Graves disease radioactive iodine 10/12  . Positive TB test   . Sleep apnea      Past Surgical History  Procedure Laterality Date  . Ankle surgery  08/06/2007    left  . Ganglion cyst removal  08/06/2003    left wrist  . Tubal ligation  2000  . Abdominal hysterectomy  2013    and BSO  . Bilateral salpingoophorectomy  2013  . Breast surgery Left 2013    biopsy-benign  . Cholecystectomy  1990     Family History  Problem Relation Age of Onset  . Cancer Mother     fallopian tube  . Arthritis Mother   . Heart disease Mother   . Diabetes Mother   . Hypertension Mother   . Cancer Maternal Aunt     Breast CA  . Diabetes Maternal Grandmother   . Diabetes Maternal Grandfather   . Diabetes Paternal Grandmother   . Diabetes Paternal Grandfather      History   Social History  . Marital Status: Married    Spouse Name: N/A    Number of Children: N/A  . Years of Education: N/A   Occupational History  . Not on file.   Social History Main Topics  . Smoking status: Former Smoker -- 0.50 packs/day for 20 years    Types: Cigarettes    Quit date: 08/05/2013  . Smokeless tobacco: Never Used  . Alcohol Use: No  . Drug Use:  No  . Sexual Activity: Yes    Birth Control/ Protection: Surgical   Other Topics Concern  . Not on file   Social History Narrative  . No narrative on file    Current Outpatient Prescriptions on File Prior to Visit  Medication Sig Dispense Refill  . albuterol (PROVENTIL HFA;VENTOLIN HFA) 108 (90 BASE) MCG/ACT inhaler Inhale 1-2 puffs into the lungs every 6 (six) hours as needed for wheezing or shortness of breath.  1 Inhaler  4  . estrogen-methylTESTOSTERone 0.625-1.25 MG per tablet Take 1 tablet by mouth daily.      Marland Kitchen levothyroxine (SYNTHROID, LEVOTHROID) 150 MCG tablet Take 1 tablet (150 mcg total) by mouth daily.  90 tablet  3  . Magnesium 100 MG CAPS Take 2 capsules by mouth 2 (two) times daily.      . SUMAtriptan (IMITREX) 100 MG tablet Take 50 mg by mouth every 2 (two) hours as needed for migraine or headache. May repeat in 2 hours if headache persists or recurs.      . topiramate (TOPAMAX) 100 MG tablet Take 1 tablet (100 mg total) by mouth daily.  90 tablet  5  .  venlafaxine XR (EFFEXOR-XR) 37.5 MG 24 hr capsule Take 1 capsule (37.5 mg total) by mouth daily.  90 capsule  5   No current facility-administered medications on file prior to visit.     Review of Systems  Constitutional: Positive for fatigue.  HENT: Negative.   Eyes: Negative.   Respiratory: Negative.   Cardiovascular: Negative.   Gastrointestinal: Negative.   Endocrine: Negative.   Genitourinary: Negative.   Musculoskeletal: Negative.   Skin: Negative.   Allergic/Immunologic: Negative.   Neurological: Positive for numbness (tingling bilateral LE. Painful legs wake her at night). Negative for dizziness, weakness, light-headedness and headaches.  Hematological: Negative.   Psychiatric/Behavioral: Negative.        Objective:  BP 110/74  Pulse 87  Temp(Src) 98.3 F (36.8 C) (Oral)  Resp 12  Ht 5\' 10"  (1.778 m)  Wt 238 lb 8 oz (108.183 kg)  BMI 34.22 kg/m2  SpO2 97%   Physical Exam  Constitutional:  She is oriented to person, place, and time. She appears well-developed and well-nourished. No distress.  HENT:  Head: Normocephalic and atraumatic.  Right Ear: External ear normal.  Left Ear: External ear normal.  Nose: Nose normal.  Mouth/Throat: Oropharynx is clear and moist.  Eyes: Conjunctivae and EOM are normal. Pupils are equal, round, and reactive to light.  Neck: Normal range of motion. Neck supple. No tracheal deviation present. No thyromegaly present.  Cardiovascular: Normal rate, regular rhythm, normal heart sounds and intact distal pulses.  Exam reveals no gallop and no friction rub.   No murmur heard. Pulmonary/Chest: Effort normal and breath sounds normal. No respiratory distress. She has no wheezes. She has no rales.  Abdominal: Soft. Bowel sounds are normal. She exhibits no distension and no mass. There is no tenderness. There is no rebound and no guarding.  Genitourinary:  PAP done with GYN 09/2013  Musculoskeletal: Normal range of motion. She exhibits no edema and no tenderness.  Lymphadenopathy:    She has no cervical adenopathy.  Neurological: She is alert and oriented to person, place, and time. She has normal reflexes. No cranial nerve deficit. Coordination normal.  Skin: Skin is warm and dry.  Psychiatric: She has a normal mood and affect. Her behavior is normal. Judgment and thought content normal.      Assessment & Plan:   1. Routine general medical examination at a health care facility Normal physical exam excluding problems listed separately. Screenings addressed. PAP done by GYN 09/2013. She is having wellness labs HgbA1c and lipids done through employer and will send to my office. - CBC with Differential - Vit D  25 hydroxy (rtn osteoporosis monitoring) - Comprehensive metabolic panel  2. Screening for breast cancer Mammogram ordered - MM DIGITAL SCREENING BILATERAL; Future  3. Numbness and tingling Check labs for deficiency that may be contributing to  symptoms. - Vitamin B12 - Iron - Ferritin

## 2013-12-18 LAB — VITAMIN D 25 HYDROXY (VIT D DEFICIENCY, FRACTURES): Vit D, 25-Hydroxy: 14 ng/mL — ABNORMAL LOW (ref 30–89)

## 2013-12-18 LAB — FERRITIN: FERRITIN: 111 ng/mL (ref 10–291)

## 2013-12-18 LAB — VITAMIN B12: Vitamin B-12: 358 pg/mL (ref 211–911)

## 2013-12-19 ENCOUNTER — Encounter: Payer: Self-pay | Admitting: Adult Health

## 2013-12-19 ENCOUNTER — Other Ambulatory Visit: Payer: Self-pay | Admitting: Adult Health

## 2013-12-19 MED ORDER — ERGOCALCIFEROL 1.25 MG (50000 UT) PO CAPS: 50000.0000 [IU] | ORAL_CAPSULE | ORAL | Status: DC

## 2013-12-20 ENCOUNTER — Encounter: Payer: Self-pay | Admitting: Adult Health

## 2013-12-20 ENCOUNTER — Other Ambulatory Visit: Payer: Self-pay | Admitting: Adult Health

## 2013-12-20 DIAGNOSIS — E785 Hyperlipidemia, unspecified: Secondary | ICD-10-CM

## 2013-12-20 DIAGNOSIS — E119 Type 2 diabetes mellitus without complications: Secondary | ICD-10-CM

## 2014-01-04 ENCOUNTER — Other Ambulatory Visit: Payer: Self-pay | Admitting: Adult Health

## 2014-01-05 ENCOUNTER — Other Ambulatory Visit: Payer: Self-pay | Admitting: Adult Health

## 2014-01-17 ENCOUNTER — Ambulatory Visit: Payer: 59 | Admitting: Adult Health

## 2014-03-10 ENCOUNTER — Other Ambulatory Visit: Payer: Self-pay | Admitting: Adult Health

## 2014-03-10 ENCOUNTER — Other Ambulatory Visit: Payer: Self-pay | Admitting: Internal Medicine

## 2014-04-18 ENCOUNTER — Telehealth: Payer: Self-pay | Admitting: Adult Health

## 2014-04-18 NOTE — Telephone Encounter (Signed)
Pt called in and said she is wanting to get labwork done and was wondering if she could get her lab orders faxed to her at [facility] at [phone]?

## 2014-04-20 ENCOUNTER — Other Ambulatory Visit: Payer: Self-pay

## 2014-04-20 ENCOUNTER — Encounter: Payer: Self-pay | Admitting: Adult Health

## 2014-04-20 DIAGNOSIS — E559 Vitamin D deficiency, unspecified: Secondary | ICD-10-CM

## 2014-04-20 NOTE — Telephone Encounter (Signed)
Message sent to Raquel via Mychart message.

## 2014-04-20 NOTE — Telephone Encounter (Signed)
Patient would like to have labs drawn at [facility]. Please advise.

## 2014-04-21 ENCOUNTER — Ambulatory Visit: Payer: Self-pay | Admitting: Adult Health

## 2014-04-21 LAB — HEMOGLOBIN A1C
HEMOGLOBIN A1C: 5.6 % (ref 4.0–6.0)
Hemoglobin A1C: 5.6 % (ref 4.2–6.3)

## 2014-04-21 LAB — LIPID PANEL
Cholesterol: 206 mg/dL — AB (ref 0–200)
Cholesterol: 206 mg/dL — ABNORMAL HIGH (ref 0–200)
HDL: 29 mg/dL — AB (ref 40–60)
HDL: 29 mg/dL — AB (ref 35–70)
LDL Cholesterol: 153 mg/dL
Ldl Cholesterol, Calc: 153 mg/dL — ABNORMAL HIGH (ref 0–100)
Triglycerides: 121 mg/dL (ref 0–200)
Triglycerides: 121 mg/dL (ref 40–160)
VLDL CHOLESTEROL, CALC: 24 mg/dL (ref 5–40)

## 2014-04-26 ENCOUNTER — Telehealth: Payer: Self-pay | Admitting: Internal Medicine

## 2014-04-26 NOTE — Telephone Encounter (Signed)
Vit D level was 18.4 which is very low. I would recommend taking over the counter Vit D 2000units daily and rechecking a level in 1 month. We should also set up an evaluation to re-establish care, 50min

## 2014-04-26 NOTE — Telephone Encounter (Signed)
Notified pt. 

## 2014-04-28 ENCOUNTER — Telehealth: Payer: Self-pay | Admitting: Internal Medicine

## 2014-04-28 NOTE — Telephone Encounter (Signed)
Sent mychart with results. Has appt scheduled 06/02/14

## 2014-04-28 NOTE — Telephone Encounter (Signed)
Labs show cholesterol is slightly elevated with LDL 153 and total cholesterol of 206. HDL is low at 29. A1c was normal at 5.6% We should have pt set up a 55min visit.

## 2014-06-02 ENCOUNTER — Encounter: Payer: Self-pay | Admitting: Internal Medicine

## 2014-06-02 ENCOUNTER — Ambulatory Visit (INDEPENDENT_AMBULATORY_CARE_PROVIDER_SITE_OTHER): Payer: 59 | Admitting: Internal Medicine

## 2014-06-02 VITALS — BP 112/62 | HR 88 | Temp 98.7°F | Ht 70.0 in | Wt 241.5 lb

## 2014-06-02 DIAGNOSIS — E785 Hyperlipidemia, unspecified: Secondary | ICD-10-CM

## 2014-06-02 DIAGNOSIS — R221 Localized swelling, mass and lump, neck: Secondary | ICD-10-CM | POA: Insufficient documentation

## 2014-06-02 DIAGNOSIS — E559 Vitamin D deficiency, unspecified: Secondary | ICD-10-CM

## 2014-06-02 NOTE — Assessment & Plan Note (Signed)
Nodule in right neck. Pt is s/p radiation for Grave's disease. Will get [place] for further evaluation.

## 2014-06-02 NOTE — Progress Notes (Signed)
Subjective:    Patient ID: [name], female    DOB: [birth year], 43 y.o.   MRN: [id number]  HPI 43YO female presents for follow up.  Recently, had labs showing elevated cholesterol with LDL 153 and total cholesterol of 206. HDL was low at 29.  Vit D was low at 18. Previously took 50,000 units per week in past, after level was 14. Currently taking 5000units daily.  Feeling well. Notes it has been a difficult year for her. Had ankle injury that limited exercise. Trying to start back exercising. Does not eat any fish or green vegetables.  Also concerned today about tender nodular area in left neck. Unsure how long this has been present. No fever, chills. No sore throat. Has h/o irradiation to her thyroid for Graves disease.   Review of Systems  Constitutional: Negative for fever, chills, appetite change, fatigue and unexpected weight change.  Eyes: Negative for visual disturbance.  Respiratory: Negative for shortness of breath.   Cardiovascular: Negative for chest pain and leg swelling.  Gastrointestinal: Negative for nausea, vomiting, abdominal pain, diarrhea, constipation and blood in stool.  Musculoskeletal: Positive for neck pain. Negative for neck stiffness.  Skin: Negative for color change and rash.  Hematological: Negative for adenopathy. Does not bruise/bleed easily.  Psychiatric/Behavioral: Negative for dysphoric mood. The patient is not nervous/anxious.        Objective:    BP 112/62  Pulse 88  Temp(Src) 98.7 F (37.1 C) (Oral)  Ht '5\' 10"'  (1.778 m)  Wt 241 lb 8 oz (109.544 kg)  BMI 34.65 kg/m2  SpO2 97% Physical Exam  Constitutional: She is oriented to person, place, and time. She appears well-developed and well-nourished. No distress.  HENT:  Head: Normocephalic and atraumatic.  Right Ear: External ear normal.  Left Ear: External ear normal.  Nose: Nose normal.  Mouth/Throat: Oropharynx is clear and moist. No oropharyngeal exudate.  Eyes:  Conjunctivae are normal. Pupils are equal, round, and reactive to light. Right eye exhibits no discharge. Left eye exhibits no discharge. No scleral icterus.  Neck: Normal range of motion. Neck supple. No tracheal deviation, no edema, no erythema and normal range of motion present. No thyromegaly present.    Cardiovascular: Normal rate, regular rhythm, normal heart sounds and intact distal pulses.  Exam reveals no gallop and no friction rub.   No murmur heard. Pulmonary/Chest: Effort normal and breath sounds normal. No accessory muscle usage. Not tachypneic. No respiratory distress. She has no decreased breath sounds. She has no wheezes. She has no rhonchi. She has no rales. She exhibits no tenderness.  Musculoskeletal: Normal range of motion. She exhibits no edema and no tenderness.  Lymphadenopathy:    She has no cervical adenopathy.  Neurological: She is alert and oriented to person, place, and time. No cranial nerve deficit. She exhibits normal muscle tone. Coordination normal.  Skin: Skin is warm and dry. No rash noted. She is not diaphoretic. No erythema. No pallor.  Psychiatric: She has a normal mood and affect. Her behavior is normal. Judgment and thought content normal.          Assessment & Plan:   Problem List Items Addressed This Visit     Unprioritized   Hyperlipidemia     Encouraged Mediterranean style diet and exercise with goal of 66mn 3 x per week. Plan to repeat CMP and lipids in 3 months.    Nodule of neck     Nodule in right neck. Pt is s/p radiation for  Grave's disease. Will get [place] for further evaluation.    Relevant Orders      US Soft Tissue Head/Neck   Vitamin D deficiency - Primary     Will recheck Vit D level with labs today.    Relevant Orders      Vitamin D (25 hydroxy)      Comp Met (CMET)       Return in about 3 months (around 09/02/2014).

## 2014-06-02 NOTE — Assessment & Plan Note (Signed)
Encouraged Mediterranean style diet and exercise with goal of 63min 3 x per week. Plan to repeat CMP and lipids in 3 months.

## 2014-06-02 NOTE — Progress Notes (Signed)
Pre visit review using our clinic review tool, if applicable. No additional management support is needed unless otherwise documented below in the visit note. 

## 2014-06-02 NOTE — Patient Instructions (Signed)
Labs today.   Follow up in 3 months.  

## 2014-06-02 NOTE — Assessment & Plan Note (Signed)
Will recheck Vit D level with labs today.

## 2014-06-03 ENCOUNTER — Telehealth: Payer: Self-pay | Admitting: Internal Medicine

## 2014-06-03 LAB — COMPREHENSIVE METABOLIC PANEL
ALBUMIN: 3.6 g/dL (ref 3.5–5.2)
ALT: 16 U/L (ref 0–35)
AST: 19 U/L (ref 0–37)
Alkaline Phosphatase: 80 U/L (ref 39–117)
BUN: 12 mg/dL (ref 6–23)
CALCIUM: 9.1 mg/dL (ref 8.4–10.5)
CHLORIDE: 106 meq/L (ref 96–112)
CO2: 24 mEq/L (ref 19–32)
CREATININE: 0.7 mg/dL (ref 0.4–1.2)
GFR: 97.76 mL/min (ref >60.00)
Glucose, Bld: 94 mg/dL (ref 70–99)
Potassium: 4.6 mEq/L (ref 3.5–5.1)
Sodium: 140 mEq/L (ref 135–145)
Total Bilirubin: 0.4 mg/dL (ref 0.2–1.2)
Total Protein: 8 g/dL (ref 6.0–8.3)

## 2014-06-03 LAB — VITAMIN D 25 HYDROXY (VIT D DEFICIENCY, FRACTURES): VITD: 36.84 ng/mL (ref 30.00–100.00)

## 2014-06-03 NOTE — Telephone Encounter (Signed)
Labs are normal.

## 2014-06-06 ENCOUNTER — Ambulatory Visit: Payer: Self-pay | Admitting: Internal Medicine

## 2014-06-06 ENCOUNTER — Telehealth: Payer: Self-pay | Admitting: Internal Medicine

## 2014-06-06 NOTE — Telephone Encounter (Signed)
US thyroid was normal.

## 2014-07-05 ENCOUNTER — Encounter: Payer: Self-pay | Admitting: Internal Medicine

## 2014-07-20 ENCOUNTER — Ambulatory Visit: Payer: Self-pay | Admitting: Obstetrics and Gynecology

## 2014-09-02 LAB — HM PAP SMEAR

## 2014-09-05 ENCOUNTER — Ambulatory Visit (INDEPENDENT_AMBULATORY_CARE_PROVIDER_SITE_OTHER): Payer: 59 | Admitting: Internal Medicine

## 2014-09-05 ENCOUNTER — Encounter: Payer: Self-pay | Admitting: Internal Medicine

## 2014-09-05 VITALS — BP 110/70 | HR 85 | Temp 98.1°F | Ht 70.0 in | Wt 248.0 lb

## 2014-09-05 DIAGNOSIS — M2550 Pain in unspecified joint: Secondary | ICD-10-CM

## 2014-09-05 DIAGNOSIS — F331 Major depressive disorder, recurrent, moderate: Secondary | ICD-10-CM

## 2014-09-05 DIAGNOSIS — E890 Postprocedural hypothyroidism: Secondary | ICD-10-CM

## 2014-09-05 DIAGNOSIS — F339 Major depressive disorder, recurrent, unspecified: Secondary | ICD-10-CM | POA: Insufficient documentation

## 2014-09-05 NOTE — Assessment & Plan Note (Signed)
Under treatment with Dr. Nicolasa Ducking. Recently changed to Cymbalta and Lamictal. Symptoms improving. Will follow up in 4 weeks.

## 2014-09-05 NOTE — Progress Notes (Signed)
Pre visit review using our clinic review tool, if applicable. No additional management support is needed unless otherwise documented below in the visit note. 

## 2014-09-05 NOTE — Progress Notes (Signed)
Subjective:    Patient ID: [name], female    DOB: 1971-04-28, 44 y.o.   MRN: [id number]  HPI  44YO female presents for follow up.  Started seeing Dr. Nicolasa Ducking for depression. Started on Cymbalta and stopped Effexor. Goal to improve depression and joint pain. Also started on Lamictal to help with mood swings.  Endocrinologist decided to use brand name Synthroid. Plans to recheck labs in March.  Sister was diagnosed with rheumatoid arthritis. Having joint pain in hands, wrists, knees and ankles. Joints feel stiff in the morning. Recently had more severe pain in right wrist with some swelling. Went to see employee health. Had brace placed for several days with some improvement.   Wt Readings from Last 3 Encounters:  09/05/14 248 lb (112.492 kg)  06/02/14 241 lb 8 oz (109.544 kg)  12/17/13 238 lb 8 oz (108.183 kg)    Past medical, surgical, family and social history per today's encounter.  Review of Systems  Constitutional: Negative for fever, chills, appetite change, fatigue and unexpected weight change.  Eyes: Negative for visual disturbance.  Respiratory: Negative for shortness of breath.   Cardiovascular: Negative for chest pain and leg swelling.  Gastrointestinal: Negative for nausea, vomiting, abdominal pain, diarrhea and constipation.  Musculoskeletal: Positive for arthralgias. Negative for myalgias.  Skin: Negative for color change and rash.  Hematological: Negative for adenopathy. Does not bruise/bleed easily.  Psychiatric/Behavioral: Positive for dysphoric mood. Negative for suicidal ideas and sleep disturbance. The patient is not nervous/anxious.        Objective:    BP 110/70 mmHg  Pulse 85  Temp(Src) 98.1 F (36.7 C) (Oral)  Ht _0  (1.778 m)  Wt 248 lb (112.492 kg)  BMI 35.58 kg/m2  SpO2 96% Physical Exam  Constitutional: She is oriented to person, place, and time. She appears well-developed and well-nourished. No distress.  HENT:  Head:  Normocephalic and atraumatic.  Right Ear: External ear normal.  Left Ear: External ear normal.  Nose: Nose normal.  Mouth/Throat: Oropharynx is clear and moist. No oropharyngeal exudate.  Eyes: Conjunctivae are normal. Pupils are equal, round, and reactive to light. Right eye exhibits no discharge. Left eye exhibits no discharge. No scleral icterus.  Neck: Normal range of motion. Neck supple. No tracheal deviation present. No thyromegaly present.  Cardiovascular: Normal rate, regular rhythm, normal heart sounds and intact distal pulses.  Exam reveals no gallop and no friction rub.   No murmur heard. Pulmonary/Chest: Effort normal and breath sounds normal. No respiratory distress. She has no wheezes. She has no rales. She exhibits no tenderness.  Musculoskeletal: Normal range of motion. She exhibits no edema.       Right wrist: She exhibits tenderness (slight diffuse over wrist ) and swelling (trace wrist joint). She exhibits normal range of motion and no bony tenderness.  Lymphadenopathy:    She has no cervical adenopathy.  Neurological: She is alert and oriented to person, place, and time. No cranial nerve deficit. She exhibits normal muscle tone. Coordination normal.  Skin: Skin is warm and dry. No rash noted. She is not diaphoretic. No erythema. No pallor.  Psychiatric: She has a normal mood and affect. Her behavior is normal. Judgment and thought content normal.          Assessment & Plan:   Problem List Items Addressed This Visit      Unprioritized   Arthralgia - Primary    Diffuse arthralgia with joint swelling right wrist. Will check labs as screen  for RA today. Discussed getting plain xray in right wrist. Follow up in 4 weeks.      Relevant Orders   ANA   Sed Rate (ESR)   C-reactive protein   Rheumatoid Factor   Cyclic citrul peptide antibody, IgG   Hypothyroidism following radioiodine therapy    Placed back on brand name Synthroid with endocrinology. Repeat labs  pending for March 2016. Will follow up.      Major depressive disorder, recurrent episode    Under treatment with Dr. Nicolasa Ducking. Recently changed to Cymbalta and Lamictal. Symptoms improving. Will follow up in 4 weeks.      Relevant Medications   DULoxetine (CYMBALTA) 60 MG capsule   traZODone (DESYREL) 100 MG tablet       Return in about 4 weeks (around 10/03/2014) for Recheck.

## 2014-09-05 NOTE — Patient Instructions (Signed)
Labs today.  Follow up in 4 weeks. 

## 2014-09-05 NOTE — Assessment & Plan Note (Signed)
Placed back on brand name Synthroid with endocrinology. Repeat labs pending for March 2016. Will follow up.

## 2014-09-05 NOTE — Assessment & Plan Note (Signed)
Diffuse arthralgia with joint swelling right wrist. Will check labs as screen for RA today. Discussed getting plain xray in right wrist. Follow up in 4 weeks.

## 2014-09-06 ENCOUNTER — Encounter: Payer: Self-pay | Admitting: Internal Medicine

## 2014-09-06 LAB — C-REACTIVE PROTEIN: CRP: 0.9 mg/dL (ref 0.5–20.0)

## 2014-09-06 LAB — ANA: ANA: POSITIVE — AB

## 2014-09-06 LAB — ANTI-NUCLEAR AB-TITER (ANA TITER): ANA Titer 1: NEGATIVE

## 2014-09-06 LAB — SEDIMENTATION RATE: SED RATE: 30 mm/h — AB (ref 0–22)

## 2014-09-06 LAB — RHEUMATOID FACTOR: Rhuematoid fact SerPl-aCnc: 29 IU/mL — ABNORMAL HIGH (ref <=14)

## 2014-09-07 LAB — CYCLIC CITRUL PEPTIDE ANTIBODY, IGG

## 2014-09-12 ENCOUNTER — Other Ambulatory Visit: Payer: Self-pay | Admitting: Internal Medicine

## 2014-09-12 DIAGNOSIS — M069 Rheumatoid arthritis, unspecified: Secondary | ICD-10-CM

## 2014-10-13 ENCOUNTER — Ambulatory Visit: Payer: 59 | Admitting: Internal Medicine

## 2014-10-17 ENCOUNTER — Ambulatory Visit (INDEPENDENT_AMBULATORY_CARE_PROVIDER_SITE_OTHER): Payer: 59 | Admitting: Internal Medicine

## 2014-10-17 ENCOUNTER — Encounter: Payer: Self-pay | Admitting: Internal Medicine

## 2014-10-17 VITALS — BP 128/70 | HR 91 | Temp 98.3°F | Resp 14 | Wt 245.2 lb

## 2014-10-17 DIAGNOSIS — R5382 Chronic fatigue, unspecified: Secondary | ICD-10-CM

## 2014-10-17 DIAGNOSIS — E890 Postprocedural hypothyroidism: Secondary | ICD-10-CM

## 2014-10-17 DIAGNOSIS — M2550 Pain in unspecified joint: Secondary | ICD-10-CM

## 2014-10-17 NOTE — Progress Notes (Signed)
Pre visit review using our clinic review tool, if applicable. No additional management support is needed unless otherwise documented below in the visit note. 

## 2014-10-17 NOTE — Assessment & Plan Note (Signed)
Persistent fatigue. Non-focal. Exam normal.Will check CBC, CMP, TSH, B12 with labs. If labs normal, discussed cardiology evaluation with stress test given strong family h/o CAD. Will request previous cardiology notes.

## 2014-10-17 NOTE — Assessment & Plan Note (Signed)
Given chronic fatigue, will check TSH with labs. Continue Levothyroxine.

## 2014-10-17 NOTE — Progress Notes (Signed)
Subjective:    Patient ID: [name], female    DOB: 14-Jan-1971, 44 y.o.   MRN: [id number]  HPI  44YO female presents for follow up.  Recently seen for arthralgia. ANA was pos, however titer was 1:40. Continues to feel tired. Stopped Trazodone and Lamictal over last 3 weeks, per Dr. Nicolasa Ducking. No improvement in fatigue. Sleeping at night for about 6 hours, but feels tired and naps in afternoon.  Trying to follow healthier diet. Stopped drinking soda.  Wt Readings from Last 3 Encounters:  10/17/14 245 lb 4 oz (111.245 kg)  09/05/14 248 lb (112.492 kg)  06/02/14 241 lb 8 oz (109.544 kg)    Past medical, surgical, family and social history per today's encounter.  Review of Systems  Constitutional: Positive for fatigue. Negative for fever, chills, appetite change and unexpected weight change.  Eyes: Negative for visual disturbance.  Respiratory: Negative for shortness of breath.   Cardiovascular: Negative for chest pain and leg swelling.  Gastrointestinal: Negative for nausea, vomiting, abdominal pain, diarrhea and constipation.  Musculoskeletal: Positive for myalgias and arthralgias.  Skin: Negative for color change and rash.  Hematological: Negative for adenopathy. Does not bruise/bleed easily.  Psychiatric/Behavioral: Negative for sleep disturbance and dysphoric mood. The patient is not nervous/anxious.        Objective:    BP 128/70 mmHg  Pulse 91  Temp(Src) 98.3 F (36.8 C) (Oral)  Resp 14  Wt 245 lb 4 oz (111.245 kg)  SpO2 95% Physical Exam  Constitutional: She is oriented to person, place, and time. She appears well-developed and well-nourished. No distress.  HENT:  Head: Normocephalic and atraumatic.  Right Ear: External ear normal.  Left Ear: External ear normal.  Nose: Nose normal.  Mouth/Throat: Oropharynx is clear and moist. No oropharyngeal exudate.  Eyes: Conjunctivae are normal. Pupils are equal, round, and reactive to light. Right eye exhibits no  discharge. Left eye exhibits no discharge. No scleral icterus.  Neck: Normal range of motion. Neck supple. No tracheal deviation present. No thyromegaly present.  Cardiovascular: Normal rate, regular rhythm, normal heart sounds and intact distal pulses.  Exam reveals no gallop and no friction rub.   No murmur heard. Pulmonary/Chest: Effort normal and breath sounds normal. No respiratory distress. She has no wheezes. She has no rales. She exhibits no tenderness.  Musculoskeletal: Normal range of motion. She exhibits no edema or tenderness.  Lymphadenopathy:    She has no cervical adenopathy.  Neurological: She is alert and oriented to person, place, and time. No cranial nerve deficit. She exhibits normal muscle tone. Coordination normal.  Skin: Skin is warm and dry. No rash noted. She is not diaphoretic. No erythema. No pallor.  Psychiatric: She has a normal mood and affect. Her behavior is normal. Judgment and thought content normal.          Assessment & Plan:   Problem List Items Addressed This Visit      Unprioritized   Arthralgia - Primary    Diffuse arthralgia. ANA weakly pos and RF pos. Rheumatology evaluation pending. Follow up here after rheumatology eval complete.      Chronic fatigue    Persistent fatigue. Non-focal. Exam normal.Will check CBC, CMP, TSH, B12 with labs. If labs normal, discussed cardiology evaluation with stress test given strong family h/o CAD. Will request previous cardiology notes.      Relevant Orders   Comprehensive metabolic panel   Vitamin D (25 hydroxy)   CBC w/Diff   TSH  T4, free   B12   Hypothyroidism following radioiodine therapy    Given chronic fatigue, will check TSH with labs. Continue Levothyroxine.          Return in about 4 months (around 02/20/2015) for Recheck.

## 2014-10-17 NOTE — Assessment & Plan Note (Signed)
Diffuse arthralgia. ANA weakly pos and RF pos. Rheumatology evaluation pending. Follow up here after rheumatology eval complete.

## 2014-10-17 NOTE — Patient Instructions (Signed)
Labs today.   Follow up in 3 months.  

## 2014-10-18 ENCOUNTER — Encounter: Payer: Self-pay | Admitting: Internal Medicine

## 2014-10-18 LAB — T4, FREE: FREE T4: 0.9 ng/dL (ref 0.60–1.60)

## 2014-10-18 LAB — COMPREHENSIVE METABOLIC PANEL
ALBUMIN: 4.3 g/dL (ref 3.5–5.2)
ALT: 15 U/L (ref 0–35)
AST: 20 U/L (ref 0–37)
Alkaline Phosphatase: 78 U/L (ref 39–117)
BUN: 12 mg/dL (ref 6–23)
CALCIUM: 9.9 mg/dL (ref 8.4–10.5)
CHLORIDE: 104 meq/L (ref 96–112)
CO2: 24 mEq/L (ref 19–32)
Creatinine, Ser: 0.76 mg/dL (ref 0.40–1.20)
GFR: 87.3 mL/min (ref >60.00)
Glucose, Bld: 57 mg/dL — ABNORMAL LOW (ref 70–99)
Potassium: 4.3 mEq/L (ref 3.5–5.1)
Sodium: 138 mEq/L (ref 135–145)
Total Bilirubin: 0.2 mg/dL (ref 0.2–1.2)
Total Protein: 8.2 g/dL (ref 6.0–8.3)

## 2014-10-18 LAB — CBC WITH DIFFERENTIAL/PLATELET
BASOS ABS: 0 10*3/uL (ref 0.0–0.1)
Basophils Relative: 0.5 % (ref 0.0–3.0)
EOS PCT: 1.8 % (ref 0.0–5.0)
Eosinophils Absolute: 0.1 10*3/uL (ref 0.0–0.7)
HEMATOCRIT: 38.8 % (ref 36.0–46.0)
HEMOGLOBIN: 13.2 g/dL (ref 12.0–15.0)
Lymphocytes Relative: 31.8 % (ref 12.0–46.0)
Lymphs Abs: 2.7 10*3/uL (ref 0.7–4.0)
MCHC: 34 g/dL (ref 30.0–36.0)
MCV: 85 fl (ref 78.0–100.0)
MONO ABS: 0.7 10*3/uL (ref 0.1–1.0)
Monocytes Relative: 7.8 % (ref 3.0–12.0)
NEUTROS PCT: 58.1 % (ref 43.0–77.0)
Neutro Abs: 4.9 10*3/uL (ref 1.4–7.7)
PLATELETS: 355 10*3/uL (ref 150.0–400.0)
RBC: 4.57 Mil/uL (ref 3.87–5.11)
RDW: 13.7 % (ref 11.5–15.5)
WBC: 8.4 10*3/uL (ref 4.0–10.5)

## 2014-10-18 LAB — TSH: TSH: 1.89 u[IU]/mL (ref 0.35–4.50)

## 2014-10-18 LAB — VITAMIN B12: Vitamin B-12: 270 pg/mL (ref 211–911)

## 2014-10-18 LAB — VITAMIN D 25 HYDROXY (VIT D DEFICIENCY, FRACTURES): VITD: 23.54 ng/mL — ABNORMAL LOW (ref 30.00–100.00)

## 2014-10-20 ENCOUNTER — Ambulatory Visit (INDEPENDENT_AMBULATORY_CARE_PROVIDER_SITE_OTHER): Payer: 59 | Admitting: *Deleted

## 2014-10-20 DIAGNOSIS — E538 Deficiency of other specified B group vitamins: Secondary | ICD-10-CM

## 2014-10-20 MED ORDER — CYANOCOBALAMIN 1000 MCG/ML IJ SOLN
1000.0000 ug | Freq: Once | INTRAMUSCULAR | Status: AC
  Administered 2014-10-20: 1000 ug via INTRAMUSCULAR

## 2014-10-27 ENCOUNTER — Ambulatory Visit (INDEPENDENT_AMBULATORY_CARE_PROVIDER_SITE_OTHER): Payer: 59 | Admitting: *Deleted

## 2014-10-27 DIAGNOSIS — E538 Deficiency of other specified B group vitamins: Secondary | ICD-10-CM

## 2014-10-27 MED ORDER — CYANOCOBALAMIN 1000 MCG/ML IJ SOLN
1000.0000 ug | Freq: Once | INTRAMUSCULAR | Status: AC
  Administered 2014-10-27: 1000 ug via INTRAMUSCULAR

## 2014-11-03 ENCOUNTER — Ambulatory Visit (INDEPENDENT_AMBULATORY_CARE_PROVIDER_SITE_OTHER): Payer: 59 | Admitting: *Deleted

## 2014-11-03 DIAGNOSIS — E538 Deficiency of other specified B group vitamins: Secondary | ICD-10-CM | POA: Diagnosis not present

## 2014-11-03 MED ORDER — CYANOCOBALAMIN 1000 MCG/ML IJ SOLN
1000.0000 ug | Freq: Once | INTRAMUSCULAR | Status: AC
  Administered 2014-11-03: 1000 ug via INTRAMUSCULAR

## 2014-11-22 NOTE — Op Note (Signed)
PATIENT NAMEARADHANA, [name] MR#:  [id number] DATE OF BIRTH:  [birth year]  DATE OF PROCEDURE:  07/22/2012  PREOPERATIVE DIAGNOSES:  1.  Chronic pelvic pain.  2.  Ablation/ligation syndrome.  3.  Abnormal uterine bleeding.   POSTOPERATIVE DIAGNOSES:  1.  Chronic pelvic pain.  2.  Ablation/ligation syndrome.  3.  Abnormal uterine bleeding.  4.  Endometriosis.   OPERATIVE PROCEDURE: Laparoscopic-assisted vaginal hysterectomy, bilateral salpingo-oophorectomy.   SURGEON: Brayton Mars, M.D.   FIRST ASSISTANT: None.   ANESTHESIA: General endotracheal.   INDICATIONS: The patient is a 44 year old married white female, para 2-0-1-2, who is status post BTL and endometrial ablation x 3, who presents for definitive surgical management of chronic pelvic pain and abnormal uterine bleeding. The patient also has a family history of fallopian tube/ovarian cancer in her mom.   FINDINGS AT SURGERY: Revealed a uterus which was top normal size and there was evidence of Falope-Rings for previous tubal ligation present. There was scarring between the ovaries and the pelvic sidewall which had to be lysed. There was evidence of endometriosis implants in the right ovarian fossa with powder burn implants being present.   DESCRIPTION OF THE PROCEDURE: The patient was brought to the operating room where she was placed in the supine position. General endotracheal anesthesia was induced without difficulty. She was placed in the low lithotomy position using the bumblebee stirrups. A ChloraPrep and Betadine abdominal, perineal, intravaginal prep and drape was performed in the standard fashion. A Foley catheter was placed and was draining clear yellow urine. A Hulka tenaculum was placed onto the cervix to facilitate uterine manipulation. A subumbilical vertical incision 5 mm in length was made. The Optiview laparoscopic trocar system was used to place a 5 mm port under direct visualization and the subumbilical  incision. No bowel or vascular injury was encountered. CO2 pneumoperitoneum was created. Two additional 5 mm ports were placed in the right and left lower quadrants respectively under direct visualization. The above-noted findings were photo documented. The procedure was then performed in the standard fashion. The Ace Harmonic scalpel as well as Kleppinger bipolar forceps were used to facilitate the completion of the procedure. The round ligaments were clamped, coagulated and cut using the Harmonic scalpel. The infundibulopelvic ligaments were then grasped with the Harmonic scalpel, desiccated and incised. The mesosalpinx was likewise clamped, desiccated and cut in standard fashion. The anterior leaf of the broad ligament was opened with the Harmonic scalpel and bladder flap was created. The uterine vessels were skeletonized. Kleppinger bipolar forceps were used to cauterize the uterine vessels bilaterally. The adnexal structures including the tubes and ovaries were in the way anatomically and they were removed from the operative field and placed in the cul-de-sac region. Once adequate takedown of pedicles was accomplished down to the level of the uterosacral ligaments, the procedure was then terminated abdominally and the remainder of the procedure was performed vaginally.   The legs were then placed in the dorsal lithotomy position. The Hulka tenaculum was removed and a double-tooth tenaculum was placed onto the cervix. Lighted Deaver was used to help with exposure and lighting. The posterior cul-de-sac was grasped and incised with Mayo scissors and the peritoneum was entered. The uterosacral ligaments were cross-clamped, cut and stick tied using 0 Vicryl sutures. These were tagged. The remainder of the cardinal broad ligament complexes were then clamped, cut and stick tied in standard fashion. The anterior vaginal mucosa was circumcised from the cervix and the bladder was dissected off the  lower uterine segment  through sharp and blunt dissection. Eventually, the anterior cul-de-sac was entered. Once the final cardinal broad ligament complexes were transected, the uterus was removed from the operative field. The adnexa were likewise removed from the operative field. The posterior colpotomy incision was closed using a pursestring baseball stitch of 0 Vicryl. This was followed by simple interrupted sutures of the vaginal mucosa using 2-0 chromic suture. Once the vaginal closure was complete, repeat laparoscopy was performed with verification of hemostasis. The pelvis was irrigated and the irrigant fluid was aspirated. The one adnexal structure was left in the abdomen and was removed through the laparoscope. This was done with the aid of an Endocatch bag. Once satisfied with surveying of the pelvis as well as the upper abdomen, the procedure was terminated with all instrumentation being removed from the abdomen and the patient had the abdominal incisions closed with Dermabond glue and 4-0 Vicryl sutures. The patient was then awakened, mobilized and taken to the recovery room in satisfactory condition.   ESTIMATED BLOOD LOSS: 250 mL.  IV FLUIDS: 1,100 mL.  URINE OUTPUT: Not quantified at the time of this dictation.   All instruments, needle, and sponge counts were verified as correct. The patient did receive Ancef antibiotic prophylaxis.     ____________________________ Alanda Slim DeFrancesco, MD mad:es D: 07/23/2012 00:03:36 ET T: 07/23/2012 09:09:31 ET JOB#: [id number]  cc: Hassell Done A. DeFrancesco, MD, <Dictator> [facility] MD ELECTRONICALLY SIGNED 07/24/2012 14:59

## 2014-12-06 ENCOUNTER — Ambulatory Visit (INDEPENDENT_AMBULATORY_CARE_PROVIDER_SITE_OTHER): Payer: 59 | Admitting: *Deleted

## 2014-12-06 DIAGNOSIS — E538 Deficiency of other specified B group vitamins: Secondary | ICD-10-CM

## 2014-12-06 MED ORDER — CYANOCOBALAMIN 1000 MCG/ML IJ SOLN
1000.0000 ug | Freq: Once | INTRAMUSCULAR | Status: AC
  Administered 2014-12-06: 1000 ug via INTRAMUSCULAR

## 2015-01-03 ENCOUNTER — Ambulatory Visit (INDEPENDENT_AMBULATORY_CARE_PROVIDER_SITE_OTHER): Payer: 59 | Admitting: *Deleted

## 2015-01-03 DIAGNOSIS — E538 Deficiency of other specified B group vitamins: Secondary | ICD-10-CM

## 2015-01-03 MED ORDER — CYANOCOBALAMIN 1000 MCG/ML IJ SOLN
1000.0000 ug | Freq: Once | INTRAMUSCULAR | Status: AC
  Administered 2015-01-03: 1000 ug via INTRAMUSCULAR

## 2015-01-12 ENCOUNTER — Other Ambulatory Visit: Payer: Self-pay | Admitting: *Deleted

## 2015-01-12 MED ORDER — PANTOPRAZOLE SODIUM 20 MG PO TBEC: 20.0000 mg | DELAYED_RELEASE_TABLET | Freq: Two times a day (BID) | ORAL | Status: DC

## 2015-02-09 ENCOUNTER — Encounter: Payer: Self-pay | Admitting: Internal Medicine

## 2015-02-16 ENCOUNTER — Other Ambulatory Visit: Payer: Self-pay | Admitting: Rheumatology

## 2015-02-16 ENCOUNTER — Encounter: Payer: Self-pay | Admitting: Internal Medicine

## 2015-02-16 ENCOUNTER — Ambulatory Visit (INDEPENDENT_AMBULATORY_CARE_PROVIDER_SITE_OTHER): Payer: 59 | Admitting: Internal Medicine

## 2015-02-16 ENCOUNTER — Other Ambulatory Visit: Admission: RE | Admit: 2015-02-16 | Payer: 59 | Source: Ambulatory Visit | Admitting: *Deleted

## 2015-02-16 ENCOUNTER — Ambulatory Visit
Admission: RE | Admit: 2015-02-16 | Discharge: 2015-02-16 | Disposition: A | Discharge status: Home / Self Care | Payer: 59 | Source: Ambulatory Visit | Attending: Rheumatology | Admitting: Rheumatology

## 2015-02-16 VITALS — BP 102/67 | HR 76 | Temp 97.9°F | Ht 70.0 in | Wt 241.4 lb

## 2015-02-16 DIAGNOSIS — K219 Gastro-esophageal reflux disease without esophagitis: Secondary | ICD-10-CM | POA: Diagnosis not present

## 2015-02-16 DIAGNOSIS — M79642 Pain in left hand: Secondary | ICD-10-CM

## 2015-02-16 DIAGNOSIS — I00 Rheumatic fever without heart involvement: Secondary | ICD-10-CM | POA: Diagnosis not present

## 2015-02-16 DIAGNOSIS — M79641 Pain in right hand: Secondary | ICD-10-CM

## 2015-02-16 DIAGNOSIS — M0520 Rheumatoid vasculitis with rheumatoid arthritis of unspecified site: Secondary | ICD-10-CM

## 2015-02-16 LAB — C-REACTIVE PROTEIN: CRP: 0.7 mg/dL (ref <1.0)

## 2015-02-16 LAB — CBC WITH DIFFERENTIAL/PLATELET
BASOS PCT: 1 %
Basophils Absolute: 0.1 10*3/uL (ref 0–0.1)
EOS ABS: 0 10*3/uL (ref 0–0.7)
Eosinophils Relative: 0 %
HCT: 40.8 % (ref 35.0–47.0)
HEMOGLOBIN: 13.6 g/dL (ref 12.0–16.0)
LYMPHS ABS: 1.8 10*3/uL (ref 1.0–3.6)
LYMPHS PCT: 19 %
MCH: 28.6 pg (ref 26.0–34.0)
MCHC: 33.2 g/dL (ref 32.0–36.0)
MCV: 85.9 fL (ref 80.0–100.0)
Monocytes Absolute: 0.3 10*3/uL (ref 0.2–0.9)
Monocytes Relative: 4 %
NEUTROS ABS: 7.1 10*3/uL — AB (ref 1.4–6.5)
NEUTROS PCT: 76 %
Platelets: 333 10*3/uL (ref 150–440)
RBC: 4.75 MIL/uL (ref 3.80–5.20)
RDW: 13.2 % (ref 11.5–14.5)
WBC: 9.3 10*3/uL (ref 3.6–11.0)

## 2015-02-16 LAB — COMPREHENSIVE METABOLIC PANEL
ALK PHOS: 89 U/L (ref 38–126)
ALT: 14 U/L (ref 14–54)
AST: 19 U/L (ref 15–41)
Albumin: 4.1 g/dL (ref 3.5–5.0)
Anion gap: 8 (ref 5–15)
BUN: 15 mg/dL (ref 6–20)
CALCIUM: 9.1 mg/dL (ref 8.9–10.3)
CHLORIDE: 106 mmol/L (ref 101–111)
CO2: 24 mmol/L (ref 22–32)
CREATININE: 0.6 mg/dL (ref 0.44–1.00)
GLUCOSE: 136 mg/dL — AB (ref 65–99)
Potassium: 4.1 mmol/L (ref 3.5–5.1)
SODIUM: 138 mmol/L (ref 135–145)
TOTAL PROTEIN: 8.6 g/dL — AB (ref 6.5–8.1)
Total Bilirubin: 0.1 mg/dL — ABNORMAL LOW (ref 0.3–1.2)

## 2015-02-16 LAB — SEDIMENTATION RATE: Sed Rate: 24 mm/hr — ABNORMAL HIGH (ref 0–20)

## 2015-02-16 MED ORDER — PANTOPRAZOLE SODIUM 40 MG PO TBEC: 40.0000 mg | DELAYED_RELEASE_TABLET | Freq: Two times a day (BID) | ORAL | Status: DC

## 2015-02-16 NOTE — Assessment & Plan Note (Signed)
GERD symptoms recently worsening. Likely exacerbated by use of NSAIDS. Will check HPylori with labs. Increase Pantoprazole to 40mg  po bid. Discussed referral to GI if symptoms persistent. Discussed potential risks of long term use of NSAIDS.

## 2015-02-16 NOTE — Progress Notes (Signed)
Subjective:    Patient ID: [name], female    DOB: 05/07/1971, 44 y.o.   MRN: [id number]  HPI  44YO female presents for follow up.  RA - Seen by Rheumatology. Started on Plaquenil and Prednisone taper. Additional labs pending. Continues to have some joint pain, esp in right hand. Follow up with rheumatology in 6 weeks pending.  GERD - Continues to have heart burn even on Pantoprazole bid. Feels burning in the back of her throat. No persistent abdominal pain. No NV. No change in bowel habits.  Past medical, surgical, family and social history per today's encounter.  Review of Systems  Constitutional: Positive for fatigue. Negative for fever, chills, appetite change and unexpected weight change.  HENT: Negative for trouble swallowing.   Eyes: Negative for visual disturbance.  Respiratory: Negative for shortness of breath.   Cardiovascular: Negative for chest pain and leg swelling.  Gastrointestinal: Negative for nausea, vomiting, abdominal pain, diarrhea and constipation.  Musculoskeletal: Positive for myalgias and arthralgias.  Skin: Negative for color change and rash.  Hematological: Negative for adenopathy. Does not bruise/bleed easily.  Psychiatric/Behavioral: Negative for dysphoric mood. The patient is not nervous/anxious.        Objective:    BP 102/67 mmHg  Pulse 76  Temp(Src) 97.9 F (36.6 C) (Oral)  Ht 5\' 10"  (1.778 m)  Wt 241 lb 6 oz (109.487 kg)  BMI 34.63 kg/m2  SpO2 99% Physical Exam  Constitutional: She is oriented to person, place, and time. She appears well-developed and well-nourished. No distress.  HENT:  Head: Normocephalic and atraumatic.  Right Ear: External ear normal.  Left Ear: External ear normal.  Nose: Nose normal.  Mouth/Throat: Oropharynx is clear and moist. No oropharyngeal exudate.  Eyes: Conjunctivae are normal. Pupils are equal, round, and reactive to light. Right eye exhibits no discharge. Left eye exhibits no discharge. No  scleral icterus.  Neck: Normal range of motion. Neck supple. No tracheal deviation present. No thyromegaly present.  Cardiovascular: Normal rate, regular rhythm, normal heart sounds and intact distal pulses.  Exam reveals no gallop and no friction rub.   No murmur heard. Pulmonary/Chest: Effort normal and breath sounds normal. No respiratory distress. She has no wheezes. She has no rales. She exhibits no tenderness.  Musculoskeletal: Normal range of motion. She exhibits no edema or tenderness.  Lymphadenopathy:    She has no cervical adenopathy.  Neurological: She is alert and oriented to person, place, and time. No cranial nerve deficit. She exhibits normal muscle tone. Coordination normal.  Skin: Skin is warm and dry. No rash noted. She is not diaphoretic. No erythema. No pallor.  Psychiatric: She has a normal mood and affect. Her behavior is normal. Judgment and thought content normal.          Assessment & Plan:   Problem List Items Addressed This Visit      Unprioritized   GERD (gastroesophageal reflux disease)    GERD symptoms recently worsening. Likely exacerbated by use of NSAIDS. Will check HPylori with labs. Increase Pantoprazole to 40mg  po bid. Discussed referral to GI if symptoms persistent. Discussed potential risks of long term use of NSAIDS.      Relevant Medications   pantoprazole (PROTONIX) 40 MG tablet   Other Relevant Orders   H. pylori breath test   Rheumatoid arteritis - Primary    Reviewed notes from recent rheumatology evaluation. Started on Plaquenil and Prednisone taper. Additional labs pending. Will follow.      Relevant Medications  predniSONE (DELTASONE) 5 MG tablet   hydroxychloroquine (PLAQUENIL) 200 MG tablet       Return in about 4 weeks (around 03/16/2015) for Recheck.

## 2015-02-16 NOTE — Assessment & Plan Note (Signed)
Reviewed notes from recent rheumatology evaluation. Started on Plaquenil and Prednisone taper. Additional labs pending. Will follow.

## 2015-02-16 NOTE — Patient Instructions (Signed)
Increase Pantoprazole to 40mg  twice daily.  We will check breath test for H. Pylori today.  Follow up 4 weeks.

## 2015-02-16 NOTE — Progress Notes (Signed)
Pre visit review using our clinic review tool, if applicable. No additional management support is needed unless otherwise documented below in the visit note. 

## 2015-02-17 LAB — PROTEIN ELECTROPHORESIS, SERUM
A/G Ratio: 0.9 (ref 0.7–1.7)
ALBUMIN ELP: 3.9 g/dL (ref 2.9–4.4)
Alpha-1-Globulin: 0.2 g/dL (ref 0.0–0.4)
Alpha-2-Globulin: 0.9 g/dL (ref 0.4–1.0)
Beta Globulin: 1.1 g/dL (ref 0.7–1.3)
GAMMA GLOBULIN: 2.2 g/dL — AB (ref 0.4–1.8)
Globulin, Total: 4.4 g/dL — ABNORMAL HIGH (ref 2.2–3.9)
TOTAL PROTEIN ELP: 8.3 g/dL (ref 6.0–8.5)

## 2015-02-17 LAB — HEPATITIS B SURFACE ANTIBODY, QUANTITATIVE: Hepatitis B-Post: 3.1 m[IU]/mL — ABNORMAL LOW (ref >9.9)

## 2015-02-17 LAB — HEPATITIS C ANTIBODY (REFLEX)

## 2015-02-17 LAB — ANTIEXTRACTABLE NUCLEAR AG: Ribonucleic Protein: <0.2 AI (ref 0.0–0.9)

## 2015-02-17 LAB — H. PYLORI BREATH TEST: H. pylori Breath Test: NOT DETECTED

## 2015-02-17 LAB — SJOGREN'S SYNDROME ANTIBODS(SSA + SSB): SSB (La) (ENA) Antibody, IgG: 4.7 AI — ABNORMAL HIGH (ref 0.0–0.9)

## 2015-02-17 LAB — HEPATITIS B CORE ANTIBODY, TOTAL: Hep B Core Total Ab: NEGATIVE

## 2015-02-17 LAB — HCV COMMENT:

## 2015-02-21 ENCOUNTER — Encounter: Payer: Self-pay | Admitting: Internal Medicine

## 2015-03-20 ENCOUNTER — Ambulatory Visit: Payer: 59 | Admitting: Internal Medicine

## 2015-03-21 ENCOUNTER — Ambulatory Visit (INDEPENDENT_AMBULATORY_CARE_PROVIDER_SITE_OTHER): Payer: 59 | Admitting: Internal Medicine

## 2015-03-21 ENCOUNTER — Encounter: Payer: Self-pay | Admitting: Internal Medicine

## 2015-03-21 VITALS — BP 95/63 | HR 104 | Temp 97.9°F | Ht 70.0 in | Wt 239.1 lb

## 2015-03-21 DIAGNOSIS — R5382 Chronic fatigue, unspecified: Secondary | ICD-10-CM | POA: Diagnosis not present

## 2015-03-21 DIAGNOSIS — F331 Major depressive disorder, recurrent, moderate: Secondary | ICD-10-CM | POA: Diagnosis not present

## 2015-03-21 DIAGNOSIS — E669 Obesity, unspecified: Secondary | ICD-10-CM | POA: Insufficient documentation

## 2015-03-21 DIAGNOSIS — K219 Gastro-esophageal reflux disease without esophagitis: Secondary | ICD-10-CM | POA: Diagnosis not present

## 2015-03-21 LAB — CBC WITH DIFFERENTIAL/PLATELET
BASOS ABS: 0.1 10*3/uL (ref 0.0–0.1)
Basophils Relative: 0.7 % (ref 0.0–3.0)
EOS ABS: 0.2 10*3/uL (ref 0.0–0.7)
Eosinophils Relative: 2.3 % (ref 0.0–5.0)
HCT: 38.3 % (ref 36.0–46.0)
Hemoglobin: 12.9 g/dL (ref 12.0–15.0)
LYMPHS PCT: 34.2 % (ref 12.0–46.0)
Lymphs Abs: 2.9 10*3/uL (ref 0.7–4.0)
MCHC: 33.7 g/dL (ref 30.0–36.0)
MCV: 85.5 fl (ref 78.0–100.0)
Monocytes Absolute: 0.7 10*3/uL (ref 0.1–1.0)
Monocytes Relative: 7.9 % (ref 3.0–12.0)
NEUTROS ABS: 4.6 10*3/uL (ref 1.4–7.7)
NEUTROS PCT: 54.9 % (ref 43.0–77.0)
Platelets: 327 10*3/uL (ref 150.0–400.0)
RBC: 4.48 Mil/uL (ref 3.87–5.11)
RDW: 13.7 % (ref 11.5–15.5)
WBC: 8.4 10*3/uL (ref 4.0–10.5)

## 2015-03-21 LAB — COMPREHENSIVE METABOLIC PANEL
ALT: 11 U/L (ref 0–35)
AST: 13 U/L (ref 0–37)
Albumin: 4.1 g/dL (ref 3.5–5.2)
Alkaline Phosphatase: 84 U/L (ref 39–117)
BILIRUBIN TOTAL: 0.3 mg/dL (ref 0.2–1.2)
BUN: 16 mg/dL (ref 6–23)
CO2: 27 meq/L (ref 19–32)
CREATININE: 0.66 mg/dL (ref 0.40–1.20)
Calcium: 9.3 mg/dL (ref 8.4–10.5)
Chloride: 104 mEq/L (ref 96–112)
GFR: 102.54 mL/min (ref >60.00)
GLUCOSE: 77 mg/dL (ref 70–99)
Potassium: 4 mEq/L (ref 3.5–5.1)
SODIUM: 138 meq/L (ref 135–145)
TOTAL PROTEIN: 7.4 g/dL (ref 6.0–8.3)

## 2015-03-21 LAB — VITAMIN B12: Vitamin B-12: 312 pg/mL (ref 211–911)

## 2015-03-21 LAB — VITAMIN D 25 HYDROXY (VIT D DEFICIENCY, FRACTURES): VITD: 30.17 ng/mL (ref 30.00–100.00)

## 2015-03-21 LAB — TSH: TSH: 2.31 u[IU]/mL (ref 0.35–4.50)

## 2015-03-21 MED ORDER — TOPIRAMATE 100 MG PO TABS: 100.0000 mg | ORAL_TABLET | Freq: Two times a day (BID) | ORAL | Status: AC

## 2015-03-21 NOTE — Progress Notes (Signed)
Subjective:    Patient ID: [name], female    DOB: September 22, 1970, 44 y.o.   MRN: [id number]  HPI  44YO female presents for follow up.  Last seen 7/14. Having some GERD symptoms. Increased Pantoprazole to 40mg  bid.  GERD - Symptoms improved. Notes increased symptoms with intake of cucumbers.  Limiting calories to 1500 daily. Limiting carbs. Frustrated by lack of weight loss. Exercising in moderation, walking.  Wt Readings from Last 3 Encounters:  03/21/15 239 lb 2 oz (108.466 kg)  02/16/15 241 lb 6 oz (109.487 kg)  10/17/14 245 lb 4 oz (111.245 kg)   Feeling more tired recently. Sleeping well at night. After work, cannot stay awake.  Depression - Some increased irritability. Questions if related to Prednisone. Compliant with Cymbalta.  Past Medical History  Diagnosis Date  . Asthma   . Depression   . Migraines   . GERD (gastroesophageal reflux disease)   . Allergy   . Hypertension   . Thyroid disease     Graves disease radioactive iodine 10/12  . Positive TB test   . Sleep apnea    Family History  Problem Relation Age of Onset  . Cancer Mother     fallopian tube  . Arthritis Mother   . Heart disease Mother   . Diabetes Mother   . Hypertension Mother   . Cancer Maternal Aunt     Breast CA  . Diabetes Maternal Grandmother   . Diabetes Maternal Grandfather   . Diabetes Paternal Grandmother   . Diabetes Paternal Grandfather    Past Surgical History  Procedure Laterality Date  . Ankle surgery  08/06/2007    left x2  . Ganglion cyst removal  08/06/2003    left wrist  . Tubal ligation  2000  . Abdominal hysterectomy  2013    and BSO  . Bilateral salpingoophorectomy  2013  . Breast surgery Left 2013    biopsy-benign  . Cholecystectomy  1990   Social History   Social History  . Marital Status: Married    Spouse Name: N/A  . Number of Children: N/A  . Years of Education: N/A   Social History Main Topics  . Smoking status: Former Smoker -- 0.50  packs/day for 20 years    Types: Cigarettes    Quit date: 08/05/2013  . Smokeless tobacco: Never Used  . Alcohol Use: No  . Drug Use: No  . Sexual Activity: Yes    Birth Control/ Protection: Surgical   Other Topics Concern  . None   Social History Narrative    Review of Systems  Constitutional: Positive for fatigue. Negative for fever, chills, appetite change and unexpected weight change.  Eyes: Negative for visual disturbance.  Respiratory: Negative for shortness of breath.   Cardiovascular: Negative for chest pain and leg swelling.  Gastrointestinal: Positive for abdominal pain (intermittent epigastric). Negative for nausea, vomiting, diarrhea and constipation.  Musculoskeletal: Negative for myalgias and arthralgias.  Skin: Negative for color change and rash.  Hematological: Negative for adenopathy. Does not bruise/bleed easily.  Psychiatric/Behavioral: Positive for dysphoric mood. Negative for suicidal ideas and sleep disturbance. The patient is nervous/anxious.        Objective:    BP 95/63 mmHg  Pulse 104  Temp(Src) 97.9 F (36.6 C) (Oral)  Ht 5\' 10"  (1.778 m)  Wt 239 lb 2 oz (108.466 kg)  BMI 34.31 kg/m2  SpO2 98% Physical Exam  Constitutional: She is oriented to person, place, and time. She appears well-developed  and well-nourished. No distress.  HENT:  Head: Normocephalic and atraumatic.  Right Ear: External ear normal.  Left Ear: External ear normal.  Nose: Nose normal.  Mouth/Throat: Oropharynx is clear and moist. No oropharyngeal exudate.  Eyes: Conjunctivae are normal. Pupils are equal, round, and reactive to light. Right eye exhibits no discharge. Left eye exhibits no discharge. No scleral icterus.  Neck: Normal range of motion. Neck supple. No tracheal deviation present. No thyromegaly present.  Cardiovascular: Normal rate, regular rhythm, normal heart sounds and intact distal pulses.  Exam reveals no gallop and no friction rub.   No murmur  heard. Pulmonary/Chest: Effort normal and breath sounds normal. No respiratory distress. She has no wheezes. She has no rales. She exhibits no tenderness.  Musculoskeletal: Normal range of motion. She exhibits no edema or tenderness.  Lymphadenopathy:    She has no cervical adenopathy.  Neurological: She is alert and oriented to person, place, and time. No cranial nerve deficit. She exhibits normal muscle tone. Coordination normal.  Skin: Skin is warm and dry. No rash noted. She is not diaphoretic. No erythema. No pallor.  Psychiatric: Her speech is normal and behavior is normal. Judgment and thought content normal. Her mood appears anxious. She exhibits a depressed mood. She expresses no suicidal ideation.          Assessment & Plan:   Problem List Items Addressed This Visit      Unprioritized   Chronic fatigue    Recent worsening of fatigue. Likely recurrent B12 deficiency, as she has been off B12 injections. Will repeat labs today including CBC, CMP, TSH, B12, Vit D. Follow up in 4 weeks.      Relevant Orders   TSH   CBC with Differential/Platelet   Comprehensive metabolic panel   Vitamin D (25 hydroxy)   B12   GERD (gastroesophageal reflux disease) - Primary    Symptoms improved some on Pantoprazole. Will continue.      Major depressive disorder, recurrent episode    Slight worsening of symptoms while on Prednisone. Now improved. Will continue Cymbalta.      Obesity (BMI 30-39.9)    Wt Readings from Last 3 Encounters:  03/21/15 239 lb 2 oz (108.466 kg)  02/16/15 241 lb 6 oz (109.487 kg)  10/17/14 245 lb 4 oz (111.245 kg)   Body mass index is 34.31 kg/(m^2). Encouraged continued efforts at healthy diet and exercise.          Return in about 4 weeks (around 04/18/2015) for Recheck.

## 2015-03-21 NOTE — Assessment & Plan Note (Signed)
Slight worsening of symptoms while on Prednisone. Now improved. Will continue Cymbalta.

## 2015-03-21 NOTE — Assessment & Plan Note (Signed)
Recent worsening of fatigue. Likely recurrent B12 deficiency, as she has been off B12 injections. Will repeat labs today including CBC, CMP, TSH, B12, Vit D. Follow up in 4 weeks.

## 2015-03-21 NOTE — Assessment & Plan Note (Signed)
Wt Readings from Last 3 Encounters:  03/21/15 239 lb 2 oz (108.466 kg)  02/16/15 241 lb 6 oz (109.487 kg)  10/17/14 245 lb 4 oz (111.245 kg)   Body mass index is 34.31 kg/(m^2). Encouraged continued efforts at healthy diet and exercise.

## 2015-03-21 NOTE — Assessment & Plan Note (Signed)
Symptoms improved some on Pantoprazole. Will continue.

## 2015-03-21 NOTE — Patient Instructions (Signed)
Labs today.  Follow up in 4 weeks. 

## 2015-03-21 NOTE — Progress Notes (Signed)
Pre visit review using our clinic review tool, if applicable. No additional management support is needed unless otherwise documented below in the visit note. 

## 2015-03-30 ENCOUNTER — Encounter: Payer: Self-pay | Admitting: Internal Medicine

## 2015-03-30 DIAGNOSIS — M25569 Pain in unspecified knee: Secondary | ICD-10-CM

## 2015-04-04 ENCOUNTER — Other Ambulatory Visit: Payer: Self-pay | Admitting: Physician Assistant

## 2015-04-04 ENCOUNTER — Ambulatory Visit
Admission: RE | Admit: 2015-04-04 | Discharge: 2015-04-04 | Disposition: A | Discharge status: Home / Self Care | Payer: 59 | Source: Ambulatory Visit | Attending: Physician Assistant | Admitting: Physician Assistant

## 2015-04-04 DIAGNOSIS — R05 Cough: Secondary | ICD-10-CM

## 2015-04-04 DIAGNOSIS — R059 Cough, unspecified: Secondary | ICD-10-CM

## 2015-04-04 DIAGNOSIS — R509 Fever, unspecified: Secondary | ICD-10-CM | POA: Insufficient documentation

## 2015-04-04 DIAGNOSIS — R918 Other nonspecific abnormal finding of lung field: Secondary | ICD-10-CM | POA: Insufficient documentation

## 2015-04-05 ENCOUNTER — Encounter: Payer: Self-pay | Admitting: Internal Medicine

## 2015-04-05 ENCOUNTER — Other Ambulatory Visit: Payer: Self-pay | Admitting: *Deleted

## 2015-04-05 MED ORDER — ALBUTEROL SULFATE HFA 108 (90 BASE) MCG/ACT IN AERS: 1.0000 | INHALATION_SPRAY | Freq: Four times a day (QID) | RESPIRATORY_TRACT | Status: AC | PRN

## 2015-04-11 ENCOUNTER — Encounter: Payer: Self-pay | Admitting: Internal Medicine

## 2015-04-13 ENCOUNTER — Ambulatory Visit: Payer: Self-pay | Admitting: Internal Medicine

## 2015-04-14 ENCOUNTER — Encounter: Payer: Self-pay | Admitting: Internal Medicine

## 2015-04-14 ENCOUNTER — Ambulatory Visit (INDEPENDENT_AMBULATORY_CARE_PROVIDER_SITE_OTHER): Payer: 59 | Admitting: Internal Medicine

## 2015-04-14 VITALS — BP 108/69 | HR 91 | Temp 98.1°F | Ht 70.0 in | Wt 233.0 lb

## 2015-04-14 DIAGNOSIS — H65491 Other chronic nonsuppurative otitis media, right ear: Secondary | ICD-10-CM | POA: Insufficient documentation

## 2015-04-14 DIAGNOSIS — J849 Interstitial pulmonary disease, unspecified: Secondary | ICD-10-CM | POA: Insufficient documentation

## 2015-04-14 NOTE — Patient Instructions (Addendum)
We will set up pulmonary evaluation and high resolution chest CT.  We will also set up ENT evaluation.

## 2015-04-14 NOTE — Assessment & Plan Note (Signed)
Reviewed previous CT chest and recent CXR showing interstitial pneumonia. Symptoms improved on Levaquin. Exam normal today. Will set up follow up Chest CT and pulmonary evaluation.

## 2015-04-14 NOTE — Progress Notes (Signed)
Pre visit review using our clinic review tool, if applicable. No additional management support is needed unless otherwise documented below in the visit note. 

## 2015-04-14 NOTE — Progress Notes (Signed)
Subjective:    Patient ID: [name], female    DOB: [birth year], 44 y.o.   MRN: [id number]  HPI  44YO female presents for follow up.  Pneumonia - Sudden onset of fever, chills, aches. Seen at employee health. Given Toradol. On follow up visit at employee health, cough had worsened. Started on Azithromycin. Had allergic reaction to Azithromycin. Changed to Levaquin. Added steroid taper. CXR showed interstitial lung markings, unable to determine acute or chronic. However previous CXR from 2007 was normal. CT chest from 2005 and 2006 in [place] showed interstitial changes. Was followed by pulmonary in the past. Continues to have cough. No fever chills. Using Tussionex for cough at night. Cough is worse at night. Generally non-productive.   Past Medical History  Diagnosis Date  . Asthma   . Depression   . Migraines   . GERD (gastroesophageal reflux disease)   . Allergy   . Hypertension   . Thyroid disease     Graves disease radioactive iodine 10/12  . Positive TB test   . Sleep apnea    Family History  Problem Relation Age of Onset  . Cancer Mother     fallopian tube  . Arthritis Mother   . Heart disease Mother   . Diabetes Mother   . Hypertension Mother   . Cancer Maternal Aunt     Breast CA  . Diabetes Maternal Grandmother   . Diabetes Maternal Grandfather   . Diabetes Paternal Grandmother   . Diabetes Paternal Grandfather    Past Surgical History  Procedure Laterality Date  . Ankle surgery  08/06/2007    left x2  . Ganglion cyst removal  08/06/2003    left wrist  . Tubal ligation  2000  . Abdominal hysterectomy  2013    and BSO  . Bilateral salpingoophorectomy  2013  . Breast surgery Left 2013    biopsy-benign  . Cholecystectomy  1990   Social History   Social History  . Marital Status: Married    Spouse Name: N/A  . Number of Children: N/A  . Years of Education: N/A   Social History Main Topics  . Smoking status: Former Smoker -- 0.50  packs/day for 20 years    Types: Cigarettes    Quit date: 08/05/2013  . Smokeless tobacco: Never Used  . Alcohol Use: No  . Drug Use: No  . Sexual Activity: Yes    Birth Control/ Protection: Surgical   Other Topics Concern  . None   Social History Narrative    Review of Systems  Constitutional: Positive for fatigue. Negative for fever, chills and unexpected weight change.  HENT: Positive for ear pain. Negative for congestion, ear discharge, facial swelling, hearing loss, mouth sores, nosebleeds, postnasal drip, rhinorrhea, sinus pressure, sneezing, sore throat, tinnitus, trouble swallowing and voice change.   Eyes: Negative for pain, discharge, redness and visual disturbance.  Respiratory: Positive for cough. Negative for chest tightness, shortness of breath, wheezing and stridor.   Cardiovascular: Negative for chest pain, palpitations and leg swelling.  Gastrointestinal: Negative for diarrhea and constipation.  Musculoskeletal: Negative for myalgias, arthralgias, neck pain and neck stiffness.  Skin: Negative for color change and rash.  Neurological: Negative for dizziness, weakness, light-headedness and headaches.  Hematological: Negative for adenopathy.  Psychiatric/Behavioral: Negative for sleep disturbance and dysphoric mood.       Objective:    BP 108/69 mmHg  Pulse 91  Temp(Src) 98.1 F (36.7 C) (Oral)  Ht 5\' 10"  (1.778 m)  Wt 233 lb (105.688 kg)  BMI 33.43 kg/m2  SpO2 96% Physical Exam  Constitutional: She is oriented to person, place, and time. She appears well-developed and well-nourished. No distress.  HENT:  Head: Normocephalic and atraumatic.  Right Ear: External ear normal. Tympanic membrane is bulging. A middle ear effusion is present.  Left Ear: Tympanic membrane and external ear normal.  Nose: Nose normal.  Mouth/Throat: Oropharynx is clear and moist. No oropharyngeal exudate.  Eyes: Conjunctivae are normal. Pupils are equal, round, and reactive to  light. Right eye exhibits no discharge. Left eye exhibits no discharge. No scleral icterus.  Neck: Normal range of motion. Neck supple. No tracheal deviation present. No thyromegaly present.  Cardiovascular: Normal rate, regular rhythm, normal heart sounds and intact distal pulses.  Exam reveals no gallop and no friction rub.   No murmur heard. Pulmonary/Chest: Effort normal and breath sounds normal. No accessory muscle usage. No respiratory distress. She has no decreased breath sounds. She has no wheezes. She has no rhonchi. She has no rales. She exhibits no tenderness.  Musculoskeletal: Normal range of motion. She exhibits no edema or tenderness.  Lymphadenopathy:    She has no cervical adenopathy.  Neurological: She is alert and oriented to person, place, and time. No cranial nerve deficit. She exhibits normal muscle tone. Coordination normal.  Skin: Skin is warm and dry. No rash noted. She is not diaphoretic. No erythema. No pallor.  Psychiatric: She has a normal mood and affect. Her behavior is normal. Judgment and thought content normal.          Assessment & Plan:   Problem List Items Addressed This Visit      Unprioritized   Chronic nonsuppurative otitis media of right ear    Right OM refractory to Levaquin and Prednisone. Will set up ENT evaluation. Question if she needs TM tube.      Relevant Orders   Ambulatory referral to ENT   Pneumonia, interstitial - Primary    Reviewed previous CT chest and recent CXR showing interstitial pneumonia. Symptoms improved on Levaquin. Exam normal today. Will set up follow up Chest CT and pulmonary evaluation.      Relevant Orders   CT Chest High Resolution   Ambulatory referral to Pulmonology       Return in about 4 weeks (around 05/12/2015) for Recheck.

## 2015-04-14 NOTE — Assessment & Plan Note (Signed)
Right OM refractory to Levaquin and Prednisone. Will set up ENT evaluation. Question if she needs TM tube.

## 2015-04-17 ENCOUNTER — Ambulatory Visit: Payer: Self-pay | Admitting: Internal Medicine

## 2015-04-17 ENCOUNTER — Ambulatory Visit
Admission: RE | Admit: 2015-04-17 | Discharge: 2015-04-17 | Disposition: A | Discharge status: Home / Self Care | Payer: 59 | Source: Ambulatory Visit | Attending: Internal Medicine | Admitting: Internal Medicine

## 2015-04-17 DIAGNOSIS — J849 Interstitial pulmonary disease, unspecified: Secondary | ICD-10-CM | POA: Insufficient documentation

## 2015-04-17 DIAGNOSIS — R911 Solitary pulmonary nodule: Secondary | ICD-10-CM | POA: Insufficient documentation

## 2015-04-17 DIAGNOSIS — J439 Emphysema, unspecified: Secondary | ICD-10-CM | POA: Diagnosis not present

## 2015-04-17 DIAGNOSIS — Z09 Encounter for follow-up examination after completed treatment for conditions other than malignant neoplasm: Secondary | ICD-10-CM | POA: Insufficient documentation

## 2015-04-17 DIAGNOSIS — D3501 Benign neoplasm of right adrenal gland: Secondary | ICD-10-CM | POA: Insufficient documentation

## 2015-04-20 ENCOUNTER — Ambulatory Visit: Payer: 59

## 2015-04-20 ENCOUNTER — Encounter: Payer: Self-pay | Admitting: Internal Medicine

## 2015-04-25 ENCOUNTER — Other Ambulatory Visit: Payer: Self-pay | Admitting: Obstetrics and Gynecology

## 2015-04-25 DIAGNOSIS — Z1231 Encounter for screening mammogram for malignant neoplasm of breast: Secondary | ICD-10-CM

## 2015-04-28 ENCOUNTER — Ambulatory Visit (INDEPENDENT_AMBULATORY_CARE_PROVIDER_SITE_OTHER): Payer: 59 | Admitting: Internal Medicine

## 2015-04-28 ENCOUNTER — Encounter: Payer: Self-pay | Admitting: Internal Medicine

## 2015-04-28 VITALS — BP 114/75 | HR 85 | Temp 98.0°F | Ht 70.0 in | Wt 236.0 lb

## 2015-04-28 VITALS — BP 138/68 | HR 72 | Ht 71.0 in | Wt 233.0 lb

## 2015-04-28 DIAGNOSIS — N390 Urinary tract infection, site not specified: Secondary | ICD-10-CM | POA: Insufficient documentation

## 2015-04-28 DIAGNOSIS — E538 Deficiency of other specified B group vitamins: Secondary | ICD-10-CM | POA: Diagnosis not present

## 2015-04-28 DIAGNOSIS — Z23 Encounter for immunization: Secondary | ICD-10-CM

## 2015-04-28 DIAGNOSIS — N3000 Acute cystitis without hematuria: Secondary | ICD-10-CM | POA: Diagnosis not present

## 2015-04-28 DIAGNOSIS — Z716 Tobacco abuse counseling: Secondary | ICD-10-CM | POA: Diagnosis not present

## 2015-04-28 DIAGNOSIS — J439 Emphysema, unspecified: Secondary | ICD-10-CM | POA: Diagnosis not present

## 2015-04-28 DIAGNOSIS — J449 Chronic obstructive pulmonary disease, unspecified: Secondary | ICD-10-CM | POA: Insufficient documentation

## 2015-04-28 LAB — POCT URINALYSIS DIPSTICK
BILIRUBIN UA: NEGATIVE
GLUCOSE UA: NEGATIVE
Ketones, UA: NEGATIVE
NITRITE UA: NEGATIVE
Protein, UA: NEGATIVE
Spec Grav, UA: 1.02
UROBILINOGEN UA: 0.2
pH, UA: 5.5

## 2015-04-28 MED ORDER — NICOTINE 14 MG/24HR TD PT24
14.0000 mg | MEDICATED_PATCH | TRANSDERMAL | Status: DC
Start: 2015-04-28 — End: 2015-08-25

## 2015-04-28 MED ORDER — CIPROFLOXACIN HCL 500 MG PO TABS: 500.0000 mg | ORAL_TABLET | Freq: Two times a day (BID) | ORAL | Status: DC

## 2015-04-28 MED ORDER — CYANOCOBALAMIN 1000 MCG/ML IJ SOLN
1000.0000 ug | Freq: Once | INTRAMUSCULAR | Status: AC
  Administered 2015-04-28: 1000 ug via INTRAMUSCULAR

## 2015-04-28 NOTE — Progress Notes (Signed)
[facility] Consultation      Date: 04/28/2015,   MRN# [id number] [name] Nov 06, 1970 Code Status:  Hosp day:@LENGTHOFSTAYDAYS @ Referring MD: @ATDPROV @     PCP:      AdmissionWeight: 233 lb (105.688 kg)                 CurrentWeight: 233 lb (105.688 kg) [name] is a 44 y.o. 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  Patient had acute SOB, fevers and cough approx 1 month ago and was diagnosed pneumonia Patient was on Oral Levaquin, steroids and albuterol inhaler as needed.  Patient has no acute resp or cardiac issues at this time, patient breathing much improved after therapy  I have reviewed the CT findings with patient and have shown her the upper lobe predominate emphysema, also seems to have resolving RLL iniltrate  Patient states that she has seen a Pulmonologist in [place] in the past for interstitial pneumonia but has not been diagnosed with COPD And  that she has had PFT's approx 12 years ago.  She has no evidence of pneumonia at this time, patient states that she is compliant with CPAP for her OSA. Patient has a dx of Sjogrens disease and is managed on Plaquenil at [place]   Past Medical History  Diagnosis Date  . Depression   . Migraines   . GERD (gastroesophageal reflux disease)   . Allergy   . Hypertension   . Thyroid disease     Graves disease radioactive iodine 10/12  . Positive TB test   . Sleep apnea   . COPD (chronic obstructive pulmonary disease)      SURGICAL HISTORY   Past Surgical History  Procedure Laterality Date  . Ankle surgery  08/06/2007    left x2  . Ganglion cyst removal  08/06/2003    left wrist  . Tubal ligation  2000  . Abdominal  hysterectomy  2013    and BSO  . Bilateral salpingoophorectomy  2013  . Breast surgery Left 2013    biopsy-benign  . Cholecystectomy  1990     FAMILY HISTORY   Family History  Problem Relation Age of Onset  . Cancer Mother     fallopian tube  . Arthritis Mother   . Heart disease Mother   . Diabetes Mother   . Hypertension Mother   . Cancer Maternal Aunt     Breast CA  . Diabetes Maternal Grandmother   . Diabetes Maternal Grandfather   . Diabetes Paternal Grandmother   . Diabetes Paternal Grandfather   . Coronary artery disease Father      SOCIAL HISTORY   Social History  Substance Use Topics  . Smoking status: Former Smoker -- 2.00 packs/day for 20 years    Types: Cigarettes    Quit date: 08/05/2013  . Smokeless tobacco: Never Used  . Alcohol Use: No     MEDICATIONS    Home Medication:  Current Outpatient Rx  Name  Route  Sig  Dispense  Refill  . acetaminophen (TYLENOL) 325 MG tablet   Oral   Take 650 mg by mouth as needed.         Marland Kitchen  albuterol (PROVENTIL HFA;VENTOLIN HFA) 108 (90 BASE) MCG/ACT inhaler   Inhalation   Inhale 1-2 puffs into the lungs every 6 (six) hours as needed for wheezing or shortness of breath.   1 Inhaler   4   . Cholecalciferol (VITAMIN D-3 PO)   Oral   Take 2,000 Units by mouth daily.          . DULoxetine (CYMBALTA) 60 MG capsule   Oral   Take 60 mg by mouth daily.         . hydroxychloroquine (PLAQUENIL) 200 MG tablet   Oral   Take 400 mg by mouth.          Marland Kitchen ibuprofen (ADVIL,MOTRIN) 800 MG tablet   Oral   Take by mouth.         . LamoTRIgine 50 MG TB24   Oral   Take 100 mg by mouth.          . levothyroxine (SYNTHROID, LEVOTHROID) 150 MCG tablet      Take 1 tablet (150 mcg total) by mouth daily.   90 tablet   3   . Magnesium 100 MG CAPS   Oral   Take 2 capsules by mouth 2 (two) times daily.         . pantoprazole (PROTONIX) 40 MG tablet   Oral   Take 1 tablet (40 mg total) by mouth 2 (two)  times daily.   60 tablet   3   . SUMAtriptan (IMITREX) 100 MG tablet   Oral   Take 50 mg by mouth every 2 (two) hours as needed for migraine or headache. May repeat in 2 hours if headache persists or recurs.         . topiramate (TOPAMAX) 100 MG tablet   Oral   Take 1 tablet (100 mg total) by mouth 2 (two) times daily.   180 tablet   0   . nicotine (NICODERM CQ - DOSED IN MG/24 HOURS) 14 mg/24hr patch   Transdermal   Place 1 patch (14 mg total) onto the skin daily.   30 patch   0     Current Medication:  Current outpatient prescriptions:  .  acetaminophen (TYLENOL) 325 MG tablet, Take 650 mg by mouth as needed., Disp: , Rfl:  .  albuterol (PROVENTIL HFA;VENTOLIN HFA) 108 (90 BASE) MCG/ACT inhaler, Inhale 1-2 puffs into the lungs every 6 (six) hours as needed for wheezing or shortness of breath., Disp: 1 Inhaler, Rfl: 4 .  Cholecalciferol (VITAMIN D-3 PO), Take 2,000 Units by mouth daily. , Disp: , Rfl:  .  DULoxetine (CYMBALTA) 60 MG capsule, Take 60 mg by mouth daily., Disp: , Rfl:  .  hydroxychloroquine (PLAQUENIL) 200 MG tablet, Take 400 mg by mouth. , Disp: , Rfl:  .  ibuprofen (ADVIL,MOTRIN) 800 MG tablet, Take by mouth., Disp: , Rfl:  .  LamoTRIgine 50 MG TB24, Take 100 mg by mouth. , Disp: , Rfl:  .  levothyroxine (SYNTHROID, LEVOTHROID) 150 MCG tablet, Take 1 tablet (150 mcg total) by mouth daily., Disp: 90 tablet, Rfl: 3 .  Magnesium 100 MG CAPS, Take 2 capsules by mouth 2 (two) times daily., Disp: , Rfl:  .  pantoprazole (PROTONIX) 40 MG tablet, Take 1 tablet (40 mg total) by mouth 2 (two) times daily., Disp: 60 tablet, Rfl: 3 .  SUMAtriptan (IMITREX) 100 MG tablet, Take 50 mg by mouth every 2 (two) hours as needed for migraine or headache. May repeat in 2 hours if headache  persists or recurs., Disp: , Rfl:  .  topiramate (TOPAMAX) 100 MG tablet, Take 1 tablet (100 mg total) by mouth 2 (two) times daily., Disp: 180 tablet, Rfl: 0 .  nicotine (NICODERM CQ - DOSED IN  MG/24 HOURS) 14 mg/24hr patch, Place 1 patch (14 mg total) onto the skin daily., Disp: 30 patch, Rfl: 0    ALLERGIES   Inh and Zithromax     REVIEW OF SYSTEMS   Review of Systems  Constitutional: Negative for fever, chills, weight loss and malaise/fatigue.  HENT: Negative for congestion and hearing loss.   Eyes: Negative for blurred vision and double vision.  Respiratory: Negative for cough, shortness of breath and wheezing.   Cardiovascular: Negative for chest pain, palpitations and orthopnea.  Gastrointestinal: Negative for heartburn, nausea, vomiting and abdominal pain.  Genitourinary: Negative for dysuria and urgency.  Musculoskeletal: Negative for myalgias.  Skin: Negative for rash.  Neurological: Negative for dizziness and headaches.  Endo/Heme/Allergies: Does not bruise/bleed easily.  Psychiatric/Behavioral: The patient is not nervous/anxious.   All other systems reviewed and are negative.    VS: BP 138/68 mmHg  Pulse 72  Ht 5\' 11"  (1.803 m)  Wt 233 lb (105.688 kg)  BMI 32.51 kg/m2  SpO2 97%     PHYSICAL EXAM  Physical Exam  Constitutional: She is oriented to person, place, and time. She appears well-developed and well-nourished. No distress.  HENT:  Head: Normocephalic and atraumatic.  Mouth/Throat: No oropharyngeal exudate.  Eyes: EOM are normal. Pupils are equal, round, and reactive to light. No scleral icterus.  Neck: Normal range of motion. Neck supple.  Cardiovascular: Normal rate, regular rhythm and normal heart sounds.   No murmur heard. Pulmonary/Chest: No stridor. No respiratory distress. She has no wheezes.  Abdominal: Soft. Bowel sounds are normal. She exhibits no distension. There is no tenderness. There is no rebound.  Musculoskeletal: Normal range of motion. She exhibits no edema.  Neurological: She is alert and oriented to person, place, and time. She displays normal reflexes. Coordination normal.  Skin: Skin is warm. She is not diaphoretic.   Psychiatric: She has a normal mood and affect.        LABS    No results for input(s): HGB, HCT, MCV, WBC, POTASSIUM, CHLORIDE, BUN, CREATININE, GLUCOSE, CALCIUM, INR, PTT in the last 72 hours.  Invalid input(s): PLATELET, BANDS, NEUTROPHIL, LYMPHOCYTE, MONOCYTE, EOSINOPHILS, BASOPHIL, SODIUM, BICARBONATE, MAGNESIUM, PHOSPHORUS, PT, SGPT, SGOT,    No results for input(s): PH in the last 72 hours.  Invalid input(s): PCO2, PO2, BASEEXCESS, BASEDEFICITE, TFT    CULTURE RESULTS   No results found for this or any previous visit (from the past 240 hour(s)).        IMAGING    Dg Chest 2 View  04/04/2015   CLINICAL DATA:  Cough and fever  EXAM: CHEST  2 VIEW  COMPARISON:  None.  FINDINGS: Heart size is normal.  Normal pulmonary vascularity  Prominent interstitial lung markings bilaterally, most prominent in the lung bases. No significant effusion. No lobar infiltrate. Negative for mass or adenopathy.  IMPRESSION: Prominent interstitial lung markings. Without the benefit of prior studies, I cannot determine if this is acute or chronic. Differential diagnosis includes interstitial scarring, atypical or viral pneumonitis, and interstitial edema.   Electronically Signed   By: Franchot Gallo M.D.   On: 04/04/2015 12:46   Ct Chest High Resolution  04/19/2015   CLINICAL DATA:  Interstitial pneumonia for 2 weeks. Cough. History of interstitial lung disease. Former  smoker.  EXAM: CT CHEST WITHOUT CONTRAST  TECHNIQUE: Multidetector CT imaging of the chest was performed following the standard protocol without intravenous contrast. High resolution imaging of the lungs, as well as inspiratory and expiratory imaging, was performed.  COMPARISON:  Chest radiograph 04/04/2015.  FINDINGS: Mediastinum/Nodes: Mediastinal lymph nodes are not enlarged by CT size criteria. Hilar regions are difficult to definitively evaluate without IV contrast. No axillary adenopathy. Heart size normal. No pericardial  effusion.  Lungs/Pleura: Centrilobular and paraseptal emphysema in the upper lobes. Minimal ill-defined peribronchovascular nodular ground-glass in the right lower lobe. Lungs are otherwise clear. No subpleural reticulation, traction bronchiectasis/bronchiolectasis, ground-glass, architectural distortion or honeycombing. Expiratory imaging does not appear to be in true expiratory phase, limiting evaluation for air trapping. No pleural fluid. Airway is unremarkable.  Upper abdomen: 5 mm low-attenuation lesion in the dome of the liver is too small characterize. Cholecystectomy. 2.3 cm fluid density nodule in the right adrenal gland is noted. Visualized portions of the left adrenal gland, kidneys, spleen, pancreas stomach and bowel are grossly unremarkable. No upper abdominal adenopathy.  Musculoskeletal: Worrisome lytic or sclerotic lesions.  IMPRESSION: 1. No evidence of interstitial lung disease. 2. Peribronchovascular ground-glass nodularity in the right lower lobe is most indicative of an infectious bronchiolitis. 3. Right adrenal adenoma.   Electronically Signed   By: Lorin Picket M.D.   On: 04/19/2015 09:02        ASSESSMENT/PLAN   44 yo white female with h/o OSA seen today for abnormal CT scan c/w upper lobe emphysema with COPD with recent diagnosis of pneumonia  COPD (chronic obstructive pulmonary disease) -Ct findings c/w upper lobe predominate Emphysema -will obtain PFT's with 6 Min walk test -use albuterol as needed -smoking cessation strongly advised -will prescribe nicotine patches -check alpha one deficiency   Follow up in 3 months  I have personally obtained a history, examined the patient, evaluated laboratory and independently reviewed imaging results, formulated the assessment and plan and placed orders.  The Patient requires high complexity decision making for assessment and support, frequent evaluation and titration of therapies, application of advanced monitoring  technologies and extensive interpretation of multiple databases. Time spent with patient  40 minutes.  Patient is satisfied with Plan of action and management.    Corrin Parker, M.D.  [facility]  Medical Director [place] Director [facility] Cardio-Pulmonary Department

## 2015-04-28 NOTE — Addendum Note (Signed)
Addended by: Vernetta Honey on: 04/28/2015 04:51 PM   Modules accepted: Orders

## 2015-04-28 NOTE — Assessment & Plan Note (Signed)
-  Continue B12 supplementation °

## 2015-04-28 NOTE — Assessment & Plan Note (Signed)
Reviewed notes from Dr. Mortimer Fries. Planning for PFTs in a few months. Encouraged smoking cessation. Testing for alpha 1 antitrypsin def pending.

## 2015-04-28 NOTE — Progress Notes (Signed)
Subjective:    Patient ID: [name], female    DOB: 1971/04/02, 44 y.o.   MRN: [id number]  HPI  44YO female presents for follow up.  Recently seen for cough and pneumonia. CT chest showed possible RLL nodularity. Seen by pulmonary medicine today. Testing pending for alpha 1 antitrypsin deficiency.  Smoking about 1pack per month. Trying to quit. Planning to start patches.  Also recently seen by Dr. Manuella Ghazi. No changes made to migraine medications.  Urinary frequency - waking over last few night to urinate. Some increased pelvic pressure. Mild dysuria. No hematuria, flank pain, fever, chills. Not taking anything for this.   Wt Readings from Last 3 Encounters:  04/28/15 236 lb (107.049 kg)  04/28/15 233 lb (105.688 kg)  04/14/15 233 lb (105.688 kg)   BP Readings from Last 3 Encounters:  04/28/15 114/75  04/28/15 138/68  04/14/15 108/69     Past Medical History  Diagnosis Date  . Depression   . Migraines   . GERD (gastroesophageal reflux disease)   . Allergy   . Hypertension   . Thyroid disease     Graves disease radioactive iodine 10/12  . Positive TB test   . Sleep apnea   . COPD (chronic obstructive pulmonary disease)    Family History  Problem Relation Age of Onset  . Cancer Mother     fallopian tube  . Arthritis Mother   . Heart disease Mother   . Diabetes Mother   . Hypertension Mother   . Cancer Maternal Aunt     Breast CA  . Diabetes Maternal Grandmother   . Diabetes Maternal Grandfather   . Diabetes Paternal Grandmother   . Diabetes Paternal Grandfather   . Coronary artery disease Father    Past Surgical History  Procedure Laterality Date  . Ankle surgery  08/06/2007    left x2  . Ganglion cyst removal  08/06/2003    left wrist  . Tubal ligation  2000  . Abdominal hysterectomy  2013    and BSO  . Bilateral salpingoophorectomy  2013  . Breast surgery Left 2013    biopsy-benign  . Cholecystectomy  1990   Social History   Social  History  . Marital Status: Married    Spouse Name: N/A  . Number of Children: N/A  . Years of Education: N/A   Social History Main Topics  . Smoking status: Former Smoker -- 2.00 packs/day for 20 years    Types: Cigarettes    Quit date: 08/05/2013  . Smokeless tobacco: Never Used  . Alcohol Use: No  . Drug Use: No  . Sexual Activity: Yes    Birth Control/ Protection: Surgical   Other Topics Concern  . None   Social History Narrative    Review of Systems  Constitutional: Negative for fever, chills, appetite change, fatigue and unexpected weight change.  Eyes: Negative for visual disturbance.  Respiratory: Negative for cough, chest tightness, shortness of breath and wheezing.   Cardiovascular: Negative for chest pain and leg swelling.  Gastrointestinal: Negative for abdominal pain.  Genitourinary: Positive for dysuria, urgency and frequency. Negative for hematuria and flank pain.  Skin: Negative for color change and rash.  Hematological: Negative for adenopathy. Does not bruise/bleed easily.  Psychiatric/Behavioral: Negative for sleep disturbance and dysphoric mood. The patient is not nervous/anxious.        Objective:    BP 114/75 mmHg  Pulse 85  Temp(Src) 98 F (36.7 C) (Oral)  Ht 5\' 10"  (1.778  m)  Wt 236 lb (107.049 kg)  BMI 33.86 kg/m2  SpO2 97% Physical Exam  Constitutional: She is oriented to person, place, and time. She appears well-developed and well-nourished. No distress.  HENT:  Head: Normocephalic and atraumatic.  Right Ear: External ear normal.  Left Ear: External ear normal.  Nose: Nose normal.  Mouth/Throat: Oropharynx is clear and moist. No oropharyngeal exudate.  Eyes: Conjunctivae are normal. Pupils are equal, round, and reactive to light. Right eye exhibits no discharge. Left eye exhibits no discharge. No scleral icterus.  Neck: Normal range of motion. Neck supple. No tracheal deviation present. No thyromegaly present.  Cardiovascular: Normal  rate, regular rhythm, normal heart sounds and intact distal pulses.  Exam reveals no gallop and no friction rub.   No murmur heard. Pulmonary/Chest: Effort normal and breath sounds normal. No respiratory distress. She has no wheezes. She has no rales. She exhibits no tenderness.  Abdominal: There is no tenderness (no CVA tenderness).  Musculoskeletal: Normal range of motion. She exhibits no edema or tenderness.  Lymphadenopathy:    She has no cervical adenopathy.  Neurological: She is alert and oriented to person, place, and time. No cranial nerve deficit. She exhibits normal muscle tone. Coordination normal.  Skin: Skin is warm and dry. No rash noted. She is not diaphoretic. No erythema. No pallor.  Psychiatric: She has a normal mood and affect. Her behavior is normal. Judgment and thought content normal.          Assessment & Plan:   Problem List Items Addressed This Visit      Unprioritized   B12 deficiency    Continue B12 supplementation.      Relevant Orders   Urine culture   COPD (chronic obstructive pulmonary disease) - Primary    Reviewed notes from Dr. Mortimer Fries. Planning for PFTs in a few months. Encouraged smoking cessation. Testing for alpha 1 antitrypsin def pending.      Relevant Orders   Urine culture   Tobacco abuse counseling    Encouraged smoking cessation. Encouraged her to use nicotine gum during periods when she typically smokes, driving to and from work.      Relevant Orders   Urine culture   UTI (urinary tract infection)    Symptoms of dysuria. UA shows trace blood and leukocytes. Will send urine for culture. Start Cipro. Follow up prn.      Relevant Orders   POCT Urinalysis Dipstick (Completed)   Urine culture       Return in about 3 months (around 07/28/2015) for Recheck.

## 2015-04-28 NOTE — Assessment & Plan Note (Signed)
Encouraged smoking cessation. Encouraged her to use nicotine gum during periods when she typically smokes, driving to and from work.

## 2015-04-28 NOTE — Assessment & Plan Note (Addendum)
-  Ct findings c/w upper lobe predominate Emphysema -will obtain PFT's with 6 Min walk test -use albuterol as needed -smoking cessation strongly advised -will prescribe nicotine patches -check alpha one levels for deficiency

## 2015-04-28 NOTE — Patient Instructions (Addendum)
Start Cipro 500mg  for urinary tract infection.  Follow up in 3 months.

## 2015-04-28 NOTE — Patient Instructions (Signed)

## 2015-04-28 NOTE — Assessment & Plan Note (Addendum)
Symptoms of dysuria. UA shows trace blood and leukocytes. Will send urine for culture. Start Cipro. Follow up prn.

## 2015-04-28 NOTE — Progress Notes (Signed)
Pre visit review using our clinic review tool, if applicable. No additional management support is needed unless otherwise documented below in the visit note. 

## 2015-04-30 LAB — URINE CULTURE

## 2015-05-30 ENCOUNTER — Ambulatory Visit (INDEPENDENT_AMBULATORY_CARE_PROVIDER_SITE_OTHER): Payer: 59 | Admitting: Surgical

## 2015-05-30 DIAGNOSIS — E538 Deficiency of other specified B group vitamins: Secondary | ICD-10-CM

## 2015-05-30 MED ORDER — CYANOCOBALAMIN 1000 MCG/ML IJ SOLN
1000.0000 ug | Freq: Once | INTRAMUSCULAR | Status: AC
  Administered 2015-05-30: 1000 ug via INTRAMUSCULAR

## 2015-05-30 NOTE — Progress Notes (Signed)
Patient came in for B 12 injection. Given Left deltoid and patient tolerated well

## 2015-06-19 ENCOUNTER — Ambulatory Visit: Payer: Self-pay | Admitting: Physician Assistant

## 2015-06-19 ENCOUNTER — Encounter: Payer: Self-pay | Admitting: Physician Assistant

## 2015-06-19 VITALS — BP 114/80 | HR 82 | Temp 98.1°F

## 2015-06-19 DIAGNOSIS — J9801 Acute bronchospasm: Secondary | ICD-10-CM

## 2015-06-19 NOTE — Progress Notes (Signed)
S.  Patient c/o cough/wheezing and nasal drainage for 5 days.  Denies fever/chill or N/V/D. Patient states only mild transit relief with Mucinex and Nyquil. O.  Mild distress, cough and wheezing.  HEENT for edematous nasal turbinates and post nasal drainage. Neck supple, Lung with upper lobe wheezing. Heart RRR. A. Bronchospasms. P.  Medrol dosepak, Tussionex, and Tessalon.  Follow up with PCP.

## 2015-06-28 ENCOUNTER — Encounter: Payer: Self-pay | Admitting: Internal Medicine

## 2015-06-28 ENCOUNTER — Ambulatory Visit: Payer: Self-pay | Admitting: Physician Assistant

## 2015-06-28 ENCOUNTER — Encounter: Payer: Self-pay | Admitting: Physician Assistant

## 2015-06-28 VITALS — BP 114/80 | Temp 98.5°F

## 2015-06-28 DIAGNOSIS — R062 Wheezing: Secondary | ICD-10-CM

## 2015-06-28 DIAGNOSIS — J208 Acute bronchitis due to other specified organisms: Secondary | ICD-10-CM

## 2015-06-28 MED ORDER — PREDNISONE 10 MG (48) PO TBPK: ORAL_TABLET | Freq: Every day | ORAL | Status: DC

## 2015-06-28 MED ORDER — FLUTICASONE-SALMETEROL 115-21 MCG/ACT IN AERO: 2.0000 | INHALATION_SPRAY | Freq: Two times a day (BID) | RESPIRATORY_TRACT | Status: DC

## 2015-06-28 MED ORDER — IPRATROPIUM-ALBUTEROL 0.5-2.5 (3) MG/3ML IN SOLN: 3.0000 mL | Freq: Once | RESPIRATORY_TRACT | Status: DC

## 2015-06-28 MED ORDER — HYDROCOD POLST-CPM POLST ER 10-8 MG/5ML PO SUER
5.0000 mL | Freq: Two times a day (BID) | ORAL | Status: DC | PRN
Start: 2015-06-28 — End: 2015-08-11

## 2015-06-28 MED ORDER — LEVOFLOXACIN 750 MG PO TABS: 750.0000 mg | ORAL_TABLET | Freq: Every day | ORAL | Status: DC

## 2015-06-28 MED ORDER — ALBUTEROL SULFATE (2.5 MG/3ML) 0.083% IN NEBU: 2.5000 mg | INHALATION_SOLUTION | Freq: Once | RESPIRATORY_TRACT | Status: DC

## 2015-06-28 MED ORDER — ALBUTEROL SULFATE HFA 108 (90 BASE) MCG/ACT IN AERS: 2.0000 | INHALATION_SPRAY | Freq: Four times a day (QID) | RESPIRATORY_TRACT | Status: DC | PRN

## 2015-06-28 NOTE — Addendum Note (Signed)
Addended by: Versie Starks on: 06/28/2015 02:14 PM   Modules accepted: Orders

## 2015-06-28 NOTE — Progress Notes (Signed)
S: C/o cough and congestion with wheezing and chest pain, chest is sore from coughing, denies fever, chills, mucus is green from her nose, cough is dry and hacking; keeping pt awake at night;  denies cardiac type chest pain or sob, v/d, abd pain Remainder ros neg; previously treated for bronchitis with medrol dose pack, tussionex, tessalon perls, no antibiotic, sx for 3 weeks  O: vitals wnl, nad, tms clear, throat injected, neck supple no lymph, lungs with wheezing, cv rrr, neuro intact, svn duoneb , pt still tight in lower lungs wheezing throughout all lung fields, svn albuterol given lungs with better air movement  A:  Acute bronchitis   P:  rx medication: levaquin 750mg  qd x 10d, sterapred ds 10mg  12 d dose pack, advair hfa 115/21 2 puffs bid; albuterol inhaler ; use otc meds, tylenol or motrin as needed for fever/chills, return if not better in 3 -5 days, return earlier if worsening, continue reg meds, discuss plaquenil use with RA doctor

## 2015-07-04 ENCOUNTER — Ambulatory Visit (INDEPENDENT_AMBULATORY_CARE_PROVIDER_SITE_OTHER): Payer: 59 | Admitting: *Deleted

## 2015-07-04 DIAGNOSIS — E538 Deficiency of other specified B group vitamins: Secondary | ICD-10-CM

## 2015-07-04 MED ORDER — CYANOCOBALAMIN 1000 MCG/ML IJ SOLN
1000.0000 ug | Freq: Once | INTRAMUSCULAR | Status: AC
  Administered 2015-07-04: 1000 ug via INTRAMUSCULAR

## 2015-07-04 NOTE — Progress Notes (Signed)
Patient presented for B 12 injection into right deltoid patient tolerated injection well.

## 2015-07-10 ENCOUNTER — Other Ambulatory Visit: Payer: Self-pay | Admitting: Internal Medicine

## 2015-07-24 ENCOUNTER — Other Ambulatory Visit: Payer: Self-pay | Admitting: Obstetrics and Gynecology

## 2015-07-24 ENCOUNTER — Ambulatory Visit
Admission: RE | Admit: 2015-07-24 | Discharge: 2015-07-24 | Disposition: A | Discharge status: Home / Self Care | Payer: 59 | Source: Ambulatory Visit | Attending: Obstetrics and Gynecology | Admitting: Obstetrics and Gynecology

## 2015-07-24 DIAGNOSIS — Z1231 Encounter for screening mammogram for malignant neoplasm of breast: Secondary | ICD-10-CM | POA: Insufficient documentation

## 2015-08-08 ENCOUNTER — Ambulatory Visit (INDEPENDENT_AMBULATORY_CARE_PROVIDER_SITE_OTHER): Payer: 59

## 2015-08-08 DIAGNOSIS — E538 Deficiency of other specified B group vitamins: Secondary | ICD-10-CM

## 2015-08-08 MED ORDER — CYANOCOBALAMIN 1000 MCG/ML IJ SOLN
1000.0000 ug | Freq: Once | INTRAMUSCULAR | Status: AC
  Administered 2015-08-08: 1000 ug via INTRAMUSCULAR

## 2015-08-08 NOTE — Progress Notes (Signed)
Patient came in for B12 injection.  Received in Left deltoid.  Patient tolerated well.  

## 2015-08-09 DIAGNOSIS — F411 Generalized anxiety disorder: Secondary | ICD-10-CM | POA: Diagnosis not present

## 2015-08-09 DIAGNOSIS — F331 Major depressive disorder, recurrent, moderate: Secondary | ICD-10-CM | POA: Diagnosis not present

## 2015-08-11 ENCOUNTER — Ambulatory Visit (INDEPENDENT_AMBULATORY_CARE_PROVIDER_SITE_OTHER): Payer: 59 | Admitting: Internal Medicine

## 2015-08-11 ENCOUNTER — Encounter: Payer: Self-pay | Admitting: Internal Medicine

## 2015-08-11 VITALS — BP 111/76 | HR 75 | Temp 98.0°F | Wt 237.0 lb

## 2015-08-11 DIAGNOSIS — M069 Rheumatoid arthritis, unspecified: Secondary | ICD-10-CM | POA: Diagnosis not present

## 2015-08-11 LAB — COMPREHENSIVE METABOLIC PANEL
ALT: 22 U/L (ref 0–35)
AST: 18 U/L (ref 0–37)
Albumin: 4.4 g/dL (ref 3.5–5.2)
Alkaline Phosphatase: 84 U/L (ref 39–117)
BILIRUBIN TOTAL: 0.3 mg/dL (ref 0.2–1.2)
BUN: 14 mg/dL (ref 6–23)
CALCIUM: 9.3 mg/dL (ref 8.4–10.5)
CHLORIDE: 105 meq/L (ref 96–112)
CO2: 26 meq/L (ref 19–32)
Creatinine, Ser: 0.68 mg/dL (ref 0.40–1.20)
GFR: 98.9 mL/min (ref >60.00)
GLUCOSE: 94 mg/dL (ref 70–99)
POTASSIUM: 4.4 meq/L (ref 3.5–5.1)
Sodium: 138 mEq/L (ref 135–145)
Total Protein: 7.5 g/dL (ref 6.0–8.3)

## 2015-08-11 LAB — CBC WITH DIFFERENTIAL/PLATELET
BASOS PCT: 0.7 % (ref 0.0–3.0)
Basophils Absolute: 0.1 10*3/uL (ref 0.0–0.1)
EOS PCT: 1.1 % (ref 0.0–5.0)
Eosinophils Absolute: 0.1 10*3/uL (ref 0.0–0.7)
HEMATOCRIT: 41.2 % (ref 36.0–46.0)
Hemoglobin: 13.6 g/dL (ref 12.0–15.0)
LYMPHS ABS: 2.6 10*3/uL (ref 0.7–4.0)
LYMPHS PCT: 30.6 % (ref 12.0–46.0)
MCHC: 33.2 g/dL (ref 30.0–36.0)
MCV: 86.3 fl (ref 78.0–100.0)
MONOS PCT: 8.3 % (ref 3.0–12.0)
Monocytes Absolute: 0.7 10*3/uL (ref 0.1–1.0)
NEUTROS ABS: 5.1 10*3/uL (ref 1.4–7.7)
NEUTROS PCT: 59.3 % (ref 43.0–77.0)
PLATELETS: 337 10*3/uL (ref 150.0–400.0)
RBC: 4.77 Mil/uL (ref 3.87–5.11)
RDW: 13 % (ref 11.5–15.5)
WBC: 8.6 10*3/uL (ref 4.0–10.5)

## 2015-08-11 LAB — SEDIMENTATION RATE: Sed Rate: 19 mm/hr (ref 0–22)

## 2015-08-11 LAB — C-REACTIVE PROTEIN: CRP: 0.4 mg/dL — ABNORMAL LOW (ref 0.5–20.0)

## 2015-08-11 MED ORDER — PREDNISONE 10 MG PO TABS: ORAL_TABLET | ORAL | Status: DC

## 2015-08-11 MED ORDER — HYDROCODONE-ACETAMINOPHEN 5-325 MG PO TABS: 1.0000 | ORAL_TABLET | Freq: Three times a day (TID) | ORAL | Status: DC | PRN

## 2015-08-11 MED ORDER — TRAMADOL HCL 50 MG PO TABS
50.0000 mg | ORAL_TABLET | Freq: Three times a day (TID) | ORAL | Status: DC | PRN
Start: 2015-08-11 — End: 2017-01-17

## 2015-08-11 NOTE — Patient Instructions (Signed)
Labs today.  We will call with results.  Hold Prednisone until labs back.  Start Tramadol 1-2 tablets every 8hr as needed for pain.  If no improvement, then take Hydrocodone/APAP 1-2 tablets as needed every 8 hrs.

## 2015-08-11 NOTE — Assessment & Plan Note (Signed)
Symptoms are most consistent with RA flare. However, markers of inflammation are normal today. We discussed adding prednisone taper. She may need a prolonged taper. She will start Tramadol for better control of pain. If no improvement over the weekend, start Prednisone taper. Hydrocodone for severe pain. Continue Plaquenil. Follow up with rheumatology as scheduled.

## 2015-08-11 NOTE — Progress Notes (Signed)
Pre visit review using our clinic review tool, if applicable. No additional management support is needed unless otherwise documented below in the visit note. 

## 2015-08-11 NOTE — Progress Notes (Signed)
Subjective:    Patient ID: [name], female    DOB: [birth year], 45 y.o.   MRN: [id number]  HPI  45YO female presents for acute visit.  Having more pain in hands and knees last two weeks. Sometimes throbbing. Some severe pain in right thumb. Some severe knee pain. Hands are stiff, burning. Keeps her awake at night. Some decreased grip strength. Occasional swelling in hands. Taking Ibuprofen and Tylenol with no improvement.  In November, was treated for bronchitis with Levaquin and Prednisone. Symptoms became much worse after she completed the Levaquin and Prednisone.  She has follow up with rheumatology in February. She has been compliant with medication.   Wt Readings from Last 3 Encounters:  08/11/15 237 lb (107.502 kg)  04/28/15 236 lb (107.049 kg)  04/28/15 233 lb (105.688 kg)   BP Readings from Last 3 Encounters:  08/11/15 111/76  06/28/15 114/80  06/19/15 114/80    Past Medical History  Diagnosis Date  . Depression   . Migraines   . GERD (gastroesophageal reflux disease)   . Allergy   . Hypertension   . Thyroid disease     Graves disease radioactive iodine 10/12  . Positive TB test   . Sleep apnea   . COPD (chronic obstructive pulmonary disease) (HCC)    Family History  Problem Relation Age of Onset  . Cancer Mother 44    fallopian tube  . Arthritis Mother   . Heart disease Mother   . Diabetes Mother   . Hypertension Mother   . Cancer Maternal Aunt     Breast CA  . Diabetes Maternal Grandmother   . Diabetes Maternal Grandfather   . Diabetes Paternal Grandmother   . Diabetes Paternal Grandfather   . Coronary artery disease Father    Past Surgical History  Procedure Laterality Date  . Ankle surgery  08/06/2007    left x2  . Ganglion cyst removal  08/06/2003    left wrist  . Tubal ligation  2000  . Abdominal hysterectomy  2013    and BSO  . Bilateral salpingoophorectomy  2013  . Breast surgery Left 2013    biopsy-benign  .  Cholecystectomy  1990   Social History   Social History  . Marital Status: Married    Spouse Name: N/A  . Number of Children: N/A  . Years of Education: N/A   Social History Main Topics  . Smoking status: Former Smoker -- 2.00 packs/day for 20 years    Types: Cigarettes    Quit date: 08/05/2013  . Smokeless tobacco: Never Used  . Alcohol Use: No  . Drug Use: No  . Sexual Activity: Yes    Birth Control/ Protection: Surgical   Other Topics Concern  . Not on file   Social History Narrative    Review of Systems  Constitutional: Positive for fatigue. Negative for fever, chills, appetite change and unexpected weight change.  Eyes: Negative for visual disturbance.  Respiratory: Negative for cough and shortness of breath.   Cardiovascular: Negative for chest pain, palpitations and leg swelling.  Gastrointestinal: Negative for abdominal pain.  Musculoskeletal: Positive for myalgias, joint swelling and arthralgias.  Skin: Negative for color change and rash.  Hematological: Negative for adenopathy. Does not bruise/bleed easily.  Psychiatric/Behavioral: Positive for sleep disturbance and dysphoric mood. Negative for suicidal ideas. The patient is not nervous/anxious.        Objective:    BP 111/76 mmHg  Pulse 75  Temp(Src) 98 F (36.7 C) (  Oral)  Wt 237 lb (107.502 kg)  SpO2 99% Physical Exam  Constitutional: She is oriented to person, place, and time. She appears well-developed and well-nourished. No distress.  HENT:  Head: Normocephalic and atraumatic.  Right Ear: External ear normal.  Left Ear: External ear normal.  Nose: Nose normal.  Mouth/Throat: Oropharynx is clear and moist. No oropharyngeal exudate.  Eyes: Conjunctivae are normal. Pupils are equal, round, and reactive to light. Right eye exhibits no discharge. Left eye exhibits no discharge. No scleral icterus.  Neck: Normal range of motion. Neck supple. No tracheal deviation present. No thyromegaly present.    Cardiovascular: Normal rate, regular rhythm, normal heart sounds and intact distal pulses.  Exam reveals no gallop and no friction rub.   No murmur heard. Pulmonary/Chest: Effort normal and breath sounds normal. No respiratory distress. She has no wheezes. She has no rales. She exhibits no tenderness.  Musculoskeletal: She exhibits no edema.       Right hand: She exhibits decreased range of motion, tenderness and swelling. Normal strength noted.       Left hand: She exhibits decreased range of motion, tenderness and swelling. Normal strength noted.  Lymphadenopathy:    She has no cervical adenopathy.  Neurological: She is alert and oriented to person, place, and time. No cranial nerve deficit. She exhibits normal muscle tone. Coordination normal.  Skin: Skin is warm and dry. No rash noted. She is not diaphoretic. No erythema. No pallor.  Psychiatric: She has a normal mood and affect. Her behavior is normal. Judgment and thought content normal.          Assessment & Plan:   Problem List Items Addressed This Visit      Unprioritized   Rheumatoid arthritis flare ([place]) - Primary    Symptoms are most consistent with RA flare. However, markers of inflammation are normal today. We discussed adding prednisone taper. She may need a prolonged taper. She will start Tramadol for better control of pain. If no improvement over the weekend, start Prednisone taper. Hydrocodone for severe pain. Continue Plaquenil. Follow up with rheumatology as scheduled.      Relevant Medications   traMADol (ULTRAM) 50 MG tablet   HYDROcodone-acetaminophen (NORCO/VICODIN) 5-325 MG tablet   predniSONE (DELTASONE) 10 MG tablet   Other Relevant Orders   CBC with Differential/Platelet (Completed)   Comprehensive metabolic panel (Completed)   Sed Rate (ESR) (Completed)   C-reactive protein (Completed)       Return in about 2 weeks (around 08/25/2015) for Recheck.

## 2015-08-14 ENCOUNTER — Encounter: Payer: Self-pay | Admitting: Internal Medicine

## 2015-08-23 DIAGNOSIS — F331 Major depressive disorder, recurrent, moderate: Secondary | ICD-10-CM | POA: Diagnosis not present

## 2015-08-23 DIAGNOSIS — F411 Generalized anxiety disorder: Secondary | ICD-10-CM | POA: Diagnosis not present

## 2015-08-25 ENCOUNTER — Encounter: Payer: Self-pay | Admitting: Internal Medicine

## 2015-08-25 ENCOUNTER — Ambulatory Visit (INDEPENDENT_AMBULATORY_CARE_PROVIDER_SITE_OTHER): Payer: 59 | Admitting: Internal Medicine

## 2015-08-25 VITALS — BP 128/82 | HR 66 | Temp 98.0°F | Resp 20 | Ht 70.0 in | Wt 241.5 lb

## 2015-08-25 DIAGNOSIS — F332 Major depressive disorder, recurrent severe without psychotic features: Secondary | ICD-10-CM

## 2015-08-25 DIAGNOSIS — J439 Emphysema, unspecified: Secondary | ICD-10-CM | POA: Diagnosis not present

## 2015-08-25 DIAGNOSIS — M069 Rheumatoid arthritis, unspecified: Secondary | ICD-10-CM | POA: Diagnosis not present

## 2015-08-25 NOTE — Assessment & Plan Note (Signed)
Symptoms well controlled presently on Advair and prn Albuterol. Will continue. Encourage smoking cessation.

## 2015-08-25 NOTE — Assessment & Plan Note (Signed)
Symptoms of joint pain and swelling improved after Prednisone taper. Follow up with rheumatology pending for next month. Continue Plaquenil.

## 2015-08-25 NOTE — Assessment & Plan Note (Signed)
Followed by psychiatry. Recent worsening depression. Dose of Cymbalta was increased. Will follow.

## 2015-08-25 NOTE — Patient Instructions (Signed)
Continue current medications    Follow up 3 months

## 2015-08-25 NOTE — Progress Notes (Signed)
Pre visit review using our clinic review tool, if applicable. No additional management support is needed unless otherwise documented below in the visit note. 

## 2015-08-25 NOTE — Progress Notes (Signed)
Subjective:    Patient ID: [name], female    DOB: Jan 10, 1971, 45 y.o.   MRN: [id number]  HPI  45YO female presents for follow up.  Recently seen for RA flare.  Symptoms are much improved after prednisone. Swelling improved. Used Tramadol with improvement in pain. Used this for 3-4 days. Now using Ibuprofen. Has follow up with Rheumatology on 2/20. Also has an evaluation with Dr. Marry Guan regarding right knee pain  Seen by Dr. Nicolasa Ducking. Cymbalta increased to 90mg  daily. Has follow up scheduled in February. Also having counseling every 2 weeks.  COPD - No recent dyspnea or cough. Compliant with Advair and prn albuterol.  Wt Readings from Last 3 Encounters:  08/25/15 241 lb 8 oz (109.544 kg)  08/11/15 237 lb (107.502 kg)  04/28/15 236 lb (107.049 kg)   BP Readings from Last 3 Encounters:  08/25/15 128/82  08/11/15 111/76  06/28/15 114/80    Past Medical History  Diagnosis Date  . Depression   . Migraines   . GERD (gastroesophageal reflux disease)   . Allergy   . Hypertension   . Thyroid disease     Graves disease radioactive iodine 10/12  . Positive TB test   . Sleep apnea   . COPD (chronic obstructive pulmonary disease) (HCC)    Family History  Problem Relation Age of Onset  . Cancer Mother 56    fallopian tube  . Arthritis Mother   . Heart disease Mother   . Diabetes Mother   . Hypertension Mother   . Cancer Maternal Aunt     Breast CA  . Diabetes Maternal Grandmother   . Diabetes Maternal Grandfather   . Diabetes Paternal Grandmother   . Diabetes Paternal Grandfather   . Coronary artery disease Father    Past Surgical History  Procedure Laterality Date  . Ankle surgery  08/06/2007    left x2  . Ganglion cyst removal  08/06/2003    left wrist  . Tubal ligation  2000  . Abdominal hysterectomy  2013    and BSO  . Bilateral salpingoophorectomy  2013  . Breast surgery Left 2013    biopsy-benign  . Cholecystectomy  1990   Social History    Social History  . Marital Status: Married    Spouse Name: N/A  . Number of Children: N/A  . Years of Education: N/A   Social History Main Topics  . Smoking status: Former Smoker -- 2.00 packs/day for 20 years    Types: Cigarettes    Quit date: 08/05/2013  . Smokeless tobacco: Never Used  . Alcohol Use: No  . Drug Use: No  . Sexual Activity: Yes    Birth Control/ Protection: Surgical   Other Topics Concern  . None   Social History Narrative    Review of Systems  Constitutional: Negative for fever, chills, appetite change, fatigue and unexpected weight change.  Eyes: Negative for visual disturbance.  Respiratory: Negative for shortness of breath.   Cardiovascular: Negative for chest pain and leg swelling.  Gastrointestinal: Negative for nausea, vomiting, abdominal pain, diarrhea and constipation.  Musculoskeletal: Negative for myalgias and arthralgias.  Skin: Negative for color change and rash.  Hematological: Negative for adenopathy. Does not bruise/bleed easily.  Psychiatric/Behavioral: Positive for dysphoric mood. Negative for suicidal ideas and sleep disturbance. The patient is not nervous/anxious.        Objective:    BP 128/82 mmHg  Pulse 66  Temp(Src) 98 F (36.7 C) (Oral)  Resp 20  Ht 5\' 10"  (1.778 m)  Wt 241 lb 8 oz (109.544 kg)  BMI 34.65 kg/m2  SpO2 93% Physical Exam  Constitutional: She is oriented to person, place, and time. She appears well-developed and well-nourished. No distress.  HENT:  Head: Normocephalic and atraumatic.  Right Ear: External ear normal.  Left Ear: External ear normal.  Nose: Nose normal.  Mouth/Throat: Oropharynx is clear and moist. No oropharyngeal exudate.  Eyes: Conjunctivae are normal. Pupils are equal, round, and reactive to light. Right eye exhibits no discharge. Left eye exhibits no discharge. No scleral icterus.  Neck: Normal range of motion. Neck supple. No tracheal deviation present. No thyromegaly present.   Cardiovascular: Normal rate, regular rhythm, normal heart sounds and intact distal pulses.  Exam reveals no gallop and no friction rub.   No murmur heard. Pulmonary/Chest: Effort normal and breath sounds normal. No respiratory distress. She has no wheezes. She has no rales. She exhibits no tenderness.  Musculoskeletal: Normal range of motion. She exhibits no edema or tenderness.  Lymphadenopathy:    She has no cervical adenopathy.  Neurological: She is alert and oriented to person, place, and time. No cranial nerve deficit. She exhibits normal muscle tone. Coordination normal.  Skin: Skin is warm and dry. No rash noted. She is not diaphoretic. No erythema. No pallor.  Psychiatric: She has a normal mood and affect. Her speech is normal and behavior is normal. Judgment and thought content normal. She expresses no suicidal ideation.          Assessment & Plan:   Problem List Items Addressed This Visit      Unprioritized   COPD (chronic obstructive pulmonary disease) ([place])    Symptoms well controlled presently on Advair and prn Albuterol. Will continue. Encourage smoking cessation.      Major depressive disorder, recurrent episode ([place])    Followed by psychiatry. Recent worsening depression. Dose of Cymbalta was increased. Will follow.      Rheumatoid arthritis flare ([facility]) - Primary    Symptoms of joint pain and swelling improved after Prednisone taper. Follow up with rheumatology pending for next month. Continue Plaquenil.          Return in about 3 months (around 11/23/2015) for Recheck.

## 2015-08-29 DIAGNOSIS — M7062 Trochanteric bursitis, left hip: Secondary | ICD-10-CM | POA: Diagnosis not present

## 2015-08-29 DIAGNOSIS — M7061 Trochanteric bursitis, right hip: Secondary | ICD-10-CM | POA: Diagnosis not present

## 2015-09-04 ENCOUNTER — Encounter: Payer: Self-pay | Admitting: *Deleted

## 2015-09-06 ENCOUNTER — Encounter: Payer: Self-pay | Admitting: Obstetrics and Gynecology

## 2015-09-06 ENCOUNTER — Ambulatory Visit (INDEPENDENT_AMBULATORY_CARE_PROVIDER_SITE_OTHER): Payer: 59 | Admitting: Obstetrics and Gynecology

## 2015-09-06 VITALS — BP 106/66 | HR 94 | Ht 70.0 in | Wt 231.9 lb

## 2015-09-06 DIAGNOSIS — Z01419 Encounter for gynecological examination (general) (routine) without abnormal findings: Secondary | ICD-10-CM

## 2015-09-06 NOTE — Patient Instructions (Addendum)
  Place annual gynecologic exam patient instructions here.  Kegel Exercises The goal of Kegel exercises is to isolate and exercise your pelvic floor muscles. These muscles act as a hammock that supports the rectum, vagina, small intestine, and uterus. As the muscles weaken, the hammock sags and these organs are displaced from their normal positions. Kegel exercises can strengthen your pelvic floor muscles and help you to improve bladder and bowel control, improve sexual response, and help reduce many problems and some discomfort during pregnancy. Kegel exercises can be done anywhere and at any time. HOW TO PERFORM KEGEL EXERCISES 1. Locate your pelvic floor muscles. To do this, squeeze (contract) the muscles that you use when you try to stop the flow of urine. You will feel a tightness in the vaginal area (women) and a tight lift in the rectal area (men and women). 2. When you begin, contract your pelvic muscles tight for 2-5 seconds, then relax them for 2-5 seconds. This is one set. Do 4-5 sets with a short pause in between. 3. Contract your pelvic muscles for 8-10 seconds, then relax them for 8-10 seconds. Do 4-5 sets. If you cannot contract your pelvic muscles for 8-10 seconds, try 5-7 seconds and work your way up to 8-10 seconds. Your goal is 4-5 sets of 10 contractions each day. Keep your stomach, buttocks, and legs relaxed during the exercises. Perform sets of both short and long contractions. Vary your positions. Perform these contractions 3-4 times per day. Perform sets while you are:   Lying in bed in the morning.  Standing at lunch.  Sitting in the late afternoon.  Lying in bed at night. You should do 40-50 contractions per day. Do not perform more Kegel exercises per day than recommended. Overexercising can cause muscle fatigue. Continue these exercises for for at least 15-20 weeks or as directed by your caregiver.   This information is not intended to replace advice given to you by  your health care provider. Make sure you discuss any questions you have with your health care provider.   Document Released: 07/08/2012 Document Revised: 08/12/2014 Document Reviewed: 07/08/2012 Elsevier Interactive Patient Education [employer].

## 2015-09-06 NOTE — Progress Notes (Signed)
Subjective:   [name] is a 45 y.o. No obstetric history on file. Caucasian female here for a routine well-woman exam.  No LMP recorded. Patient has had a hysterectomy.    Current complaints: leaking urine random PCP: Ronette Deter       Doesn't  desire labs  Social History: Sexual: heterosexual Marital Status: married Living situation: with spouse Occupation: works at admissions at [employer] Tobacco/alcohol: no tobacco use Illicit drugs: no history of illicit drug use  The following portions of the patient's history were reviewed and updated as appropriate: allergies, current medications, past family history, past medical history, past social history, past surgical history and problem list.  Past Medical History Past Medical History  Diagnosis Date  . Depression   . Migraines   . GERD (gastroesophageal reflux disease)   . Allergy   . Hypertension   . Thyroid disease     Graves disease radioactive iodine 10/12  . Positive TB test   . Sleep apnea   . COPD (chronic obstructive pulmonary disease) [facility])     Past Surgical History Past Surgical History  Procedure Laterality Date  . Ankle surgery  08/06/2007    left x2  . Ganglion cyst removal  08/06/2003    left wrist  . Tubal ligation  2000  . Abdominal hysterectomy  2013    and BSO  . Bilateral salpingoophorectomy  2013  . Breast surgery Left 2013    biopsy-benign  . Cholecystectomy  1990    Gynecologic History No obstetric history on file.  No LMP recorded. Patient has had a hysterectomy. Contraception: status post hysterectomy Last Pap: 2015. Results were: normal Last mammogram: 2016. Results were: normal   Obstetric History OB History  No data available    Current Medications Current Outpatient Prescriptions on File Prior to Visit  Medication Sig Dispense Refill  . acetaminophen (TYLENOL) 325 MG tablet Take 650 mg by mouth as needed.    Marland Kitchen albuterol (PROVENTIL HFA;VENTOLIN HFA) 108 (90 BASE) MCG/ACT  inhaler Inhale 1-2 puffs into the lungs every 6 (six) hours as needed for wheezing or shortness of breath. 1 Inhaler 4  . albuterol (PROVENTIL HFA;VENTOLIN HFA) 108 (90 BASE) MCG/ACT inhaler Inhale 2 puffs into the lungs every 6 (six) hours as needed for wheezing or shortness of breath. 1 Inhaler 2  . Cholecalciferol (VITAMIN D-3 PO) Take 5,000 Units by mouth daily.     . DULoxetine (CYMBALTA) 60 MG capsule Take 90 mg by mouth daily.     . fluticasone-salmeterol (ADVAIR HFA) 115-21 MCG/ACT inhaler Inhale 2 puffs into the lungs 2 (two) times daily. 1 Inhaler 12  . HYDROcodone-acetaminophen (NORCO/VICODIN) 5-325 MG tablet Take 1-2 tablets by mouth 3 (three) times daily as needed for severe pain. 90 tablet 0  . hydroxychloroquine (PLAQUENIL) 200 MG tablet Take 400 mg by mouth.     Marland Kitchen ibuprofen (ADVIL,MOTRIN) 800 MG tablet Take by mouth.    . LamoTRIgine 50 MG TB24 Take 150 mg by mouth.     . levothyroxine (SYNTHROID, LEVOTHROID) 150 MCG tablet Take 1 tablet (150 mcg total) by mouth daily. 90 tablet 3  . Magnesium 100 MG CAPS Take 2 capsules by mouth 2 (two) times daily.    . pantoprazole (PROTONIX) 40 MG tablet TAKE 1 TABLET (40 MG TOTAL) BY MOUTH 2 (TWO) TIMES DAILY. 60 tablet 3  . SUMAtriptan (IMITREX) 100 MG tablet Take 50 mg by mouth every 2 (two) hours as needed for migraine or headache. May repeat in  2 hours if headache persists or recurs.    . topiramate (TOPAMAX) 100 MG tablet Take 1 tablet (100 mg total) by mouth 2 (two) times daily. 180 tablet 0  . traMADol (ULTRAM) 50 MG tablet Take 1-2 tablets (50-100 mg total) by mouth every 8 (eight) hours as needed for severe pain. 90 tablet 1   No current facility-administered medications on file prior to visit.    Review of Systems Patient denies any headaches, blurred vision, shortness of breath, chest pain, abdominal pain, problems with bowel movements, urination, or intercourse.  Objective:  BP 106/66 mmHg  Pulse 94  Ht 5\' 10"  (1.778 m)  Wt  231 lb 14.4 oz (105.189 kg)  BMI 33.27 kg/m2 Physical Exam  General:  Well developed, well nourished, no acute distress. She is alert and oriented x3. Skin:  Warm and dry Neck:  Midline trachea, no thyromegaly or nodules Cardiovascular: Regular rate and rhythm, no murmur heard Lungs:  Effort normal, all lung fields clear to auscultation bilaterally Breasts:  No dominant palpable mass, retraction, or nipple discharge Abdomen:  Soft, non tender, no hepatosplenomegaly or masses Pelvic:  External genitalia is normal in appearance.  The vagina is normal in appearance. The cervix is bulbous, no CMT. Moderate tone with Kegel. Thin prep pap is not done.  Extremities:  No swelling or varicosities noted Psych:  She has a normal mood and affect  Assessment:   Healthy well-woman exam Obesity RA Urinary leakage of unknown etilogy  Plan:  kegels daily F/U 1 year for AE, or sooner if needed Mammogram scheduled or sooner if problems  Melody Rockney Ghee, CNM

## 2015-09-12 ENCOUNTER — Ambulatory Visit (INDEPENDENT_AMBULATORY_CARE_PROVIDER_SITE_OTHER): Payer: 59 | Admitting: Surgical

## 2015-09-12 DIAGNOSIS — E538 Deficiency of other specified B group vitamins: Secondary | ICD-10-CM

## 2015-09-12 MED ORDER — CYANOCOBALAMIN 1000 MCG/ML IJ SOLN
1000.0000 ug | Freq: Once | INTRAMUSCULAR | Status: AC
  Administered 2015-09-12: 1000 ug via INTRAMUSCULAR

## 2015-09-12 NOTE — Progress Notes (Signed)
B12 injection given in right deltoid. Patient tolerated

## 2015-09-18 DIAGNOSIS — F331 Major depressive disorder, recurrent, moderate: Secondary | ICD-10-CM | POA: Diagnosis not present

## 2015-09-18 DIAGNOSIS — F411 Generalized anxiety disorder: Secondary | ICD-10-CM | POA: Diagnosis not present

## 2015-09-27 DIAGNOSIS — Z79899 Other long term (current) drug therapy: Secondary | ICD-10-CM | POA: Diagnosis not present

## 2015-09-27 DIAGNOSIS — M3500 Sicca syndrome, unspecified: Secondary | ICD-10-CM | POA: Diagnosis not present

## 2015-09-27 DIAGNOSIS — M0579 Rheumatoid arthritis with rheumatoid factor of multiple sites without organ or systems involvement: Secondary | ICD-10-CM | POA: Diagnosis not present

## 2015-10-02 DIAGNOSIS — F331 Major depressive disorder, recurrent, moderate: Secondary | ICD-10-CM | POA: Diagnosis not present

## 2015-10-02 DIAGNOSIS — F411 Generalized anxiety disorder: Secondary | ICD-10-CM | POA: Diagnosis not present

## 2015-10-03 ENCOUNTER — Other Ambulatory Visit: Payer: Self-pay | Admitting: Obstetrics and Gynecology

## 2015-10-03 ENCOUNTER — Encounter: Payer: Self-pay | Admitting: Obstetrics and Gynecology

## 2015-10-03 MED ORDER — CLOTRIMAZOLE-BETAMETHASONE 1-0.05 % EX CREA: 1.0000 "application " | TOPICAL_CREAM | Freq: Two times a day (BID) | CUTANEOUS | Status: DC

## 2015-10-11 DIAGNOSIS — F411 Generalized anxiety disorder: Secondary | ICD-10-CM | POA: Diagnosis not present

## 2015-10-11 DIAGNOSIS — F331 Major depressive disorder, recurrent, moderate: Secondary | ICD-10-CM | POA: Diagnosis not present

## 2015-10-16 DIAGNOSIS — F331 Major depressive disorder, recurrent, moderate: Secondary | ICD-10-CM | POA: Diagnosis not present

## 2015-10-16 DIAGNOSIS — F411 Generalized anxiety disorder: Secondary | ICD-10-CM | POA: Diagnosis not present

## 2015-10-17 ENCOUNTER — Ambulatory Visit (INDEPENDENT_AMBULATORY_CARE_PROVIDER_SITE_OTHER): Payer: 59

## 2015-10-17 DIAGNOSIS — E538 Deficiency of other specified B group vitamins: Secondary | ICD-10-CM

## 2015-10-17 MED ORDER — CYANOCOBALAMIN 1000 MCG/ML IJ SOLN
1000.0000 ug | Freq: Once | INTRAMUSCULAR | Status: AC
  Administered 2015-10-17: 1000 ug via INTRAMUSCULAR

## 2015-10-17 NOTE — Progress Notes (Signed)
Patient came in for b12 injection.  Received in Left deltoid.  Patient tolerated well  

## 2015-10-30 DIAGNOSIS — F411 Generalized anxiety disorder: Secondary | ICD-10-CM | POA: Diagnosis not present

## 2015-10-30 DIAGNOSIS — F331 Major depressive disorder, recurrent, moderate: Secondary | ICD-10-CM | POA: Diagnosis not present

## 2015-11-08 DIAGNOSIS — M0579 Rheumatoid arthritis with rheumatoid factor of multiple sites without organ or systems involvement: Secondary | ICD-10-CM | POA: Diagnosis not present

## 2015-11-08 DIAGNOSIS — Z79899 Other long term (current) drug therapy: Secondary | ICD-10-CM | POA: Diagnosis not present

## 2015-11-08 DIAGNOSIS — M3500 Sicca syndrome, unspecified: Secondary | ICD-10-CM | POA: Diagnosis not present

## 2015-11-09 ENCOUNTER — Encounter
Admission: RE | Admit: 2015-11-09 | Discharge: 2015-11-09 | Disposition: A | Discharge status: Home / Self Care | Payer: 59 | Source: Ambulatory Visit | Attending: Rheumatology | Admitting: Rheumatology

## 2015-11-09 DIAGNOSIS — M0579 Rheumatoid arthritis with rheumatoid factor of multiple sites without organ or systems involvement: Secondary | ICD-10-CM | POA: Diagnosis not present

## 2015-11-09 LAB — CBC WITH DIFFERENTIAL/PLATELET
Basophils Absolute: 0.1 10*3/uL (ref 0–0.1)
Basophils Relative: 1 %
EOS PCT: 1 %
Eosinophils Absolute: 0.1 10*3/uL (ref 0–0.7)
HEMATOCRIT: 39.4 % (ref 35.0–47.0)
HEMOGLOBIN: 13.1 g/dL (ref 12.0–16.0)
LYMPHS ABS: 2 10*3/uL (ref 1.0–3.6)
LYMPHS PCT: 23 %
MCH: 29.5 pg (ref 26.0–34.0)
MCHC: 33.3 g/dL (ref 32.0–36.0)
MCV: 88.5 fL (ref 80.0–100.0)
Monocytes Absolute: 0.7 10*3/uL (ref 0.2–0.9)
Monocytes Relative: 8 %
NEUTROS ABS: 6 10*3/uL (ref 1.4–6.5)
Neutrophils Relative %: 67 %
PLATELETS: 314 10*3/uL (ref 150–440)
RBC: 4.45 MIL/uL (ref 3.80–5.20)
RDW: 14.8 % — ABNORMAL HIGH (ref 11.5–14.5)
WBC: 8.8 10*3/uL (ref 3.6–11.0)

## 2015-11-09 LAB — CREATININE, SERUM
Creatinine, Ser: 0.63 mg/dL (ref 0.44–1.00)
GFR calc non Af Amer: >60 mL/min (ref >60)

## 2015-11-09 LAB — AST: AST: 16 U/L (ref 15–41)

## 2015-11-09 LAB — SEDIMENTATION RATE: SED RATE: 24 mm/h — AB (ref 0–20)

## 2015-11-09 LAB — C-REACTIVE PROTEIN: CRP: 0.8 mg/dL (ref <1.0)

## 2015-11-09 LAB — ALT: ALT: 16 U/L (ref 14–54)

## 2015-11-13 ENCOUNTER — Other Ambulatory Visit: Payer: Self-pay | Admitting: Internal Medicine

## 2015-11-14 NOTE — Telephone Encounter (Signed)
Rx refill sent to pharmacy. 

## 2015-11-20 DIAGNOSIS — F39 Unspecified mood [affective] disorder: Secondary | ICD-10-CM | POA: Diagnosis not present

## 2015-11-20 DIAGNOSIS — F411 Generalized anxiety disorder: Secondary | ICD-10-CM | POA: Diagnosis not present

## 2015-11-21 ENCOUNTER — Ambulatory Visit (INDEPENDENT_AMBULATORY_CARE_PROVIDER_SITE_OTHER): Payer: 59

## 2015-11-21 DIAGNOSIS — E538 Deficiency of other specified B group vitamins: Secondary | ICD-10-CM

## 2015-11-21 MED ORDER — CYANOCOBALAMIN 1000 MCG/ML IJ SOLN
1000.0000 ug | Freq: Once | INTRAMUSCULAR | Status: AC
  Administered 2015-11-21: 1000 ug via INTRAMUSCULAR

## 2015-11-21 NOTE — Progress Notes (Signed)
Patient came in for B12 injection.  Received in Right deltoid.  Patient tolerated well.  

## 2015-11-22 DIAGNOSIS — L309 Dermatitis, unspecified: Secondary | ICD-10-CM | POA: Diagnosis not present

## 2015-11-27 DIAGNOSIS — F331 Major depressive disorder, recurrent, moderate: Secondary | ICD-10-CM | POA: Diagnosis not present

## 2015-11-27 DIAGNOSIS — F411 Generalized anxiety disorder: Secondary | ICD-10-CM | POA: Diagnosis not present

## 2015-11-29 ENCOUNTER — Ambulatory Visit (INDEPENDENT_AMBULATORY_CARE_PROVIDER_SITE_OTHER): Payer: 59 | Admitting: Internal Medicine

## 2015-11-29 ENCOUNTER — Encounter: Payer: Self-pay | Admitting: Internal Medicine

## 2015-11-29 VITALS — BP 94/69 | HR 81 | Ht 70.0 in | Wt 239.0 lb

## 2015-11-29 DIAGNOSIS — L989 Disorder of the skin and subcutaneous tissue, unspecified: Secondary | ICD-10-CM | POA: Insufficient documentation

## 2015-11-29 DIAGNOSIS — F332 Major depressive disorder, recurrent severe without psychotic features: Secondary | ICD-10-CM | POA: Diagnosis not present

## 2015-11-29 DIAGNOSIS — B372 Candidiasis of skin and nail: Secondary | ICD-10-CM | POA: Diagnosis not present

## 2015-11-29 DIAGNOSIS — M069 Rheumatoid arthritis, unspecified: Secondary | ICD-10-CM

## 2015-11-29 MED ORDER — FLUCONAZOLE 150 MG PO TABS: 150.0000 mg | ORAL_TABLET | ORAL | Status: DC

## 2015-11-29 NOTE — Progress Notes (Signed)
Subjective:    Patient ID: [name], female    DOB: 11-26-1970, 45 y.o.   MRN: [id number]  HPI  45YO female presents for followup.  Last seen in 08/2015 for RA flare.  RA - started on Methotrexate and still on Prednisone 5mg  daily. Also on Plaquenil.  Depression - Seen by Dr. Nicolasa Ducking. Dose of Lamictal. Notes some improvement in irritability with this.  Rash - Right foot. Not itching. Red and raised. Present for over 3 months. No improvement with topical antifungals, bleach.  Wt Readings from Last 3 Encounters:  11/29/15 239 lb (108.41 kg)  09/06/15 231 lb 14.4 oz (105.189 kg)  08/25/15 241 lb 8 oz (109.544 kg)   BP Readings from Last 3 Encounters:  11/29/15 94/69  09/06/15 106/66  08/25/15 128/82    Past Medical History  Diagnosis Date  . Depression   . Migraines   . GERD (gastroesophageal reflux disease)   . Allergy   . Hypertension   . Thyroid disease     Graves disease radioactive iodine 10/12  . Positive TB test   . Sleep apnea   . COPD (chronic obstructive pulmonary disease) (HCC)    Family History  Problem Relation Age of Onset  . Cancer Mother 31    fallopian tube  . Arthritis Mother   . Heart disease Mother   . Diabetes Mother   . Hypertension Mother   . Cancer Maternal Aunt     Breast CA  . Diabetes Maternal Grandmother   . Diabetes Maternal Grandfather   . Diabetes Paternal Grandmother   . Diabetes Paternal Grandfather   . Coronary artery disease Father    Past Surgical History  Procedure Laterality Date  . Ankle surgery  08/06/2007    left x2  . Ganglion cyst removal  08/06/2003    left wrist  . Tubal ligation  2000  . Abdominal hysterectomy  2013    and BSO  . Bilateral salpingoophorectomy  2013  . Breast surgery Left 2013    biopsy-benign  . Cholecystectomy  1990   Social History   Social History  . Marital Status: Married    Spouse Name: N/A  . Number of Children: N/A  . Years of Education: N/A   Social History Main  Topics  . Smoking status: Former Smoker -- 2.00 packs/day for 20 years    Types: Cigarettes    Quit date: 08/05/2013  . Smokeless tobacco: Never Used  . Alcohol Use: No  . Drug Use: No  . Sexual Activity: Yes    Birth Control/ Protection: Surgical   Other Topics Concern  . None   Social History Narrative    Review of Systems  Constitutional: Positive for fatigue. Negative for fever, chills, appetite change and unexpected weight change.  Eyes: Negative for visual disturbance.  Respiratory: Negative for cough, shortness of breath and wheezing.   Cardiovascular: Negative for chest pain and leg swelling.  Gastrointestinal: Negative for nausea, vomiting, abdominal pain, diarrhea and constipation.  Musculoskeletal: Positive for myalgias and arthralgias.  Skin: Positive for color change and rash.  Hematological: Negative for adenopathy. Does not bruise/bleed easily.  Psychiatric/Behavioral: Positive for sleep disturbance. Negative for dysphoric mood. The patient is not nervous/anxious.        Objective:    BP 94/69 mmHg  Pulse 81  Ht 5\' 10"  (1.778 m)  Wt 239 lb (108.41 kg)  BMI 34.29 kg/m2  SpO2 100% Physical Exam  Constitutional: She is oriented to person, place,  and time. She appears well-developed and well-nourished. No distress.  HENT:  Head: Normocephalic and atraumatic.  Right Ear: External ear normal.  Left Ear: External ear normal.  Nose: Nose normal.  Mouth/Throat: Oropharynx is clear and moist. No oropharyngeal exudate.  Eyes: Conjunctivae are normal. Pupils are equal, round, and reactive to light. Right eye exhibits no discharge. Left eye exhibits no discharge. No scleral icterus.  Neck: Normal range of motion. Neck supple. No tracheal deviation present. No thyromegaly present.  Cardiovascular: Normal rate, regular rhythm, normal heart sounds and intact distal pulses.  Exam reveals no gallop and no friction rub.   No murmur heard. Pulmonary/Chest: Effort normal  and breath sounds normal. No respiratory distress. She has no wheezes. She has no rales. She exhibits no tenderness.  Musculoskeletal: Normal range of motion. She exhibits no edema or tenderness.  Lymphadenopathy:    She has no cervical adenopathy.  Neurological: She is alert and oriented to person, place, and time. No cranial nerve deficit. She exhibits normal muscle tone. Coordination normal.  Skin: Skin is warm and dry. Rash noted. Rash is maculopapular (erythematous rash under breast bilaterally c/w candidal dermatitis). She is not diaphoretic. No erythema. No pallor.     Psychiatric: She has a normal mood and affect. Her behavior is normal. Judgment and thought content normal.          Assessment & Plan:   Problem List Items Addressed This Visit      Unprioritized   Candidiasis of skin    Rash under breasts consistent with candidal dermatitis. No improvement with topical steroids and antifungals. Will start Diflucan weekly x 3 weeks. She will call if no improvement.      Relevant Medications   fluconazole (DIFLUCAN) 150 MG tablet   Major depressive disorder, recurrent episode ([place])    Symptoms improved with addition of Lamictal by Dr. Nicolasa Ducking. Will continue to follow up with psychiatry.      Rheumatoid arthritis flare (HCC) - Primary    RA symptoms of arthralgia, myalgia. Reviewed notes from rheumatology. Started on Methotrexate and continues on Prednisone and Plaquenil. Will continue to follow.      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 Will set up dermatology evaluation for biopsy.      Relevant Orders   Ambulatory referral to Dermatology       Return in about 4 weeks (around 12/27/2015) for Recheck.  Ronette Deter, MD [facility]

## 2015-11-29 NOTE — Assessment & Plan Note (Signed)
Symptoms improved with addition of Lamictal by Dr. Nicolasa Ducking. Will continue to follow up with psychiatry.

## 2015-11-29 NOTE — Assessment & Plan Note (Signed)
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 Will set up dermatology evaluation for biopsy.

## 2015-11-29 NOTE — Patient Instructions (Signed)
Start Diflucan weekly x 3 weeks.  We will set up dermatology evaluation.

## 2015-11-29 NOTE — Assessment & Plan Note (Signed)
Rash under breasts consistent with candidal dermatitis. No improvement with topical steroids and antifungals. Will start Diflucan weekly x 3 weeks. She will call if no improvement.

## 2015-11-29 NOTE — Assessment & Plan Note (Signed)
RA symptoms of arthralgia, myalgia. Reviewed notes from rheumatology. Started on Methotrexate and continues on Prednisone and Plaquenil. Will continue to follow.

## 2015-11-30 ENCOUNTER — Encounter: Payer: Self-pay | Admitting: Internal Medicine

## 2015-11-30 DIAGNOSIS — Z79891 Long term (current) use of opiate analgesic: Secondary | ICD-10-CM | POA: Diagnosis not present

## 2015-12-01 DIAGNOSIS — B351 Tinea unguium: Secondary | ICD-10-CM | POA: Diagnosis not present

## 2015-12-11 DIAGNOSIS — F331 Major depressive disorder, recurrent, moderate: Secondary | ICD-10-CM | POA: Diagnosis not present

## 2015-12-11 DIAGNOSIS — F411 Generalized anxiety disorder: Secondary | ICD-10-CM | POA: Diagnosis not present

## 2015-12-20 ENCOUNTER — Encounter: Payer: Self-pay | Admitting: Internal Medicine

## 2015-12-25 DIAGNOSIS — F331 Major depressive disorder, recurrent, moderate: Secondary | ICD-10-CM | POA: Diagnosis not present

## 2015-12-25 DIAGNOSIS — F411 Generalized anxiety disorder: Secondary | ICD-10-CM | POA: Diagnosis not present

## 2015-12-26 DIAGNOSIS — E538 Deficiency of other specified B group vitamins: Secondary | ICD-10-CM | POA: Diagnosis not present

## 2015-12-26 DIAGNOSIS — E559 Vitamin D deficiency, unspecified: Secondary | ICD-10-CM | POA: Diagnosis not present

## 2015-12-26 DIAGNOSIS — G43109 Migraine with aura, not intractable, without status migrainosus: Secondary | ICD-10-CM | POA: Diagnosis not present

## 2015-12-26 DIAGNOSIS — G4733 Obstructive sleep apnea (adult) (pediatric): Secondary | ICD-10-CM | POA: Diagnosis not present

## 2015-12-27 ENCOUNTER — Ambulatory Visit (INDEPENDENT_AMBULATORY_CARE_PROVIDER_SITE_OTHER): Payer: 59 | Admitting: Internal Medicine

## 2015-12-27 ENCOUNTER — Telehealth: Payer: Self-pay

## 2015-12-27 ENCOUNTER — Ambulatory Visit: Payer: Self-pay

## 2015-12-27 ENCOUNTER — Encounter: Payer: Self-pay | Admitting: Internal Medicine

## 2015-12-27 VITALS — BP 110/70 | HR 88 | Ht 70.0 in | Wt 226.0 lb

## 2015-12-27 DIAGNOSIS — L989 Disorder of the skin and subcutaneous tissue, unspecified: Secondary | ICD-10-CM | POA: Diagnosis not present

## 2015-12-27 DIAGNOSIS — E890 Postprocedural hypothyroidism: Secondary | ICD-10-CM

## 2015-12-27 DIAGNOSIS — M069 Rheumatoid arthritis, unspecified: Secondary | ICD-10-CM

## 2015-12-27 LAB — CBC WITH DIFFERENTIAL/PLATELET
BASOS ABS: 0.1 10*3/uL (ref 0.0–0.1)
Basophils Relative: 0.8 % (ref 0.0–3.0)
EOS PCT: 1 % (ref 0.0–5.0)
Eosinophils Absolute: 0.1 10*3/uL (ref 0.0–0.7)
HEMATOCRIT: 39.6 % (ref 36.0–46.0)
Hemoglobin: 13.2 g/dL (ref 12.0–15.0)
LYMPHS ABS: 1.9 10*3/uL (ref 0.7–4.0)
LYMPHS PCT: 24.5 % (ref 12.0–46.0)
MCHC: 33.3 g/dL (ref 30.0–36.0)
MCV: 89.1 fl (ref 78.0–100.0)
MONOS PCT: 6.9 % (ref 3.0–12.0)
Monocytes Absolute: 0.5 10*3/uL (ref 0.1–1.0)
NEUTROS ABS: 5.2 10*3/uL (ref 1.4–7.7)
NEUTROS PCT: 66.8 % (ref 43.0–77.0)
PLATELETS: 337 10*3/uL (ref 150.0–400.0)
RBC: 4.44 Mil/uL (ref 3.87–5.11)
RDW: 14.7 % (ref 11.5–15.5)
WBC: 7.8 10*3/uL (ref 4.0–10.5)

## 2015-12-27 LAB — COMPREHENSIVE METABOLIC PANEL
ALK PHOS: 78 U/L (ref 39–117)
ALT: 20 U/L (ref 0–35)
AST: 14 U/L (ref 0–37)
Albumin: 4.4 g/dL (ref 3.5–5.2)
BILIRUBIN TOTAL: 0.3 mg/dL (ref 0.2–1.2)
BUN: 11 mg/dL (ref 6–23)
CALCIUM: 9.4 mg/dL (ref 8.4–10.5)
CO2: 29 meq/L (ref 19–32)
Chloride: 105 mEq/L (ref 96–112)
Creatinine, Ser: 0.66 mg/dL (ref 0.40–1.20)
GFR: 102.19 mL/min (ref >60.00)
Glucose, Bld: 88 mg/dL (ref 70–99)
POTASSIUM: 4.2 meq/L (ref 3.5–5.1)
Sodium: 138 mEq/L (ref 135–145)
TOTAL PROTEIN: 7.6 g/dL (ref 6.0–8.3)

## 2015-12-27 LAB — TSH: TSH: 6.11 u[IU]/mL — ABNORMAL HIGH (ref 0.35–4.50)

## 2015-12-27 LAB — C-REACTIVE PROTEIN: CRP: 0.3 mg/dL — AB (ref 0.5–20.0)

## 2015-12-27 LAB — SEDIMENTATION RATE: SED RATE: 22 mm/h — AB (ref 0–20)

## 2015-12-27 MED ORDER — CYANOCOBALAMIN 1000 MCG/ML IJ SOLN
1000.0000 ug | Freq: Once | INTRAMUSCULAR | Status: AC
  Administered 2015-12-27: 1000 ug via INTRAMUSCULAR

## 2015-12-27 NOTE — Assessment & Plan Note (Signed)
Symptoms of recent flare improving. Will check labs including CBC, ESR, CRP, CMP today and forward to rheumatology. Continue current medications. Encouraged yearly eye exam on Plaquenil.

## 2015-12-27 NOTE — Assessment & Plan Note (Signed)
Skin lesion over foot was biopsied by dermatology and reported as fungal infection. She was treated with Naftin with resolution.

## 2015-12-27 NOTE — Patient Instructions (Addendum)
Labs today.   Follow up in 3 months.  

## 2015-12-27 NOTE — Telephone Encounter (Signed)
Tried to contact patient regarding B12 injections.  Phone number not valid.  Mychart message sent.

## 2015-12-27 NOTE — Progress Notes (Signed)
Pre visit review using our clinic review tool, if applicable. No additional management support is needed unless otherwise documented below in the visit note. 

## 2015-12-27 NOTE — Assessment & Plan Note (Signed)
Will check TSH with labs. 

## 2015-12-27 NOTE — Progress Notes (Signed)
Subjective:    Patient ID: [name], female    DOB: June 11, 1971, 45 y.o.   MRN: [id number]  HPI  45YO female presents for follow up.  Recently seen for RA flare. Continues to have some intermittent joint pain. Using pain medication only rarely, less than once per week. Uses Tramadol as needed during day.  Foot lesion was biopsied and diagnosed as fungal infection. Treated with Naftin with improvement.    Wt Readings from Last 3 Encounters:  12/27/15 226 lb (102.513 kg)  11/29/15 239 lb (108.41 kg)  09/06/15 231 lb 14.4 oz (105.189 kg)   BP Readings from Last 3 Encounters:  12/27/15 110/70  11/29/15 94/69  09/06/15 106/66    Past Medical History  Diagnosis Date  . Depression   . Migraines   . GERD (gastroesophageal reflux disease)   . Allergy   . Hypertension   . Thyroid disease     Graves disease radioactive iodine 10/12  . Positive TB test   . Sleep apnea   . COPD (chronic obstructive pulmonary disease) (HCC)    Family History  Problem Relation Age of Onset  . Cancer Mother 39    fallopian tube  . Arthritis Mother   . Heart disease Mother   . Diabetes Mother   . Hypertension Mother   . Cancer Maternal Aunt     Breast CA  . Diabetes Maternal Grandmother   . Diabetes Maternal Grandfather   . Diabetes Paternal Grandmother   . Diabetes Paternal Grandfather   . Coronary artery disease Father    Past Surgical History  Procedure Laterality Date  . Ankle surgery  08/06/2007    left x2  . Ganglion cyst removal  08/06/2003    left wrist  . Tubal ligation  2000  . Abdominal hysterectomy  2013    and BSO  . Bilateral salpingoophorectomy  2013  . Breast surgery Left 2013    biopsy-benign  . Cholecystectomy  1990   Social History   Social History  . Marital Status: Married    Spouse Name: N/A  . Number of Children: N/A  . Years of Education: N/A   Social History Main Topics  . Smoking status: Former Smoker -- 2.00 packs/day for 20 years   Types: Cigarettes    Quit date: 08/05/2013  . Smokeless tobacco: Never Used  . Alcohol Use: No  . Drug Use: No  . Sexual Activity: Yes    Birth Control/ Protection: Surgical   Other Topics Concern  . None   Social History Narrative    Review of Systems  Constitutional: Negative for fever, chills, appetite change, fatigue and unexpected weight change.  Eyes: Negative for visual disturbance.  Respiratory: Negative for shortness of breath.   Cardiovascular: Negative for chest pain and leg swelling.  Gastrointestinal: Negative for nausea, vomiting, abdominal pain, diarrhea and constipation.  Musculoskeletal: Positive for myalgias and arthralgias.  Skin: Negative for color change and rash.  Hematological: Negative for adenopathy. Does not bruise/bleed easily.  Psychiatric/Behavioral: Negative for sleep disturbance and dysphoric mood. The patient is not nervous/anxious.        Objective:    BP 110/70 mmHg  Pulse 88  Ht '5\' 10"'  (1.778 m)  Wt 226 lb (102.513 kg)  BMI 32.43 kg/m2  SpO2 97% Physical Exam  Constitutional: She is oriented to person, place, and time. She appears well-developed and well-nourished. No distress.  HENT:  Head: Normocephalic and atraumatic.  Right Ear: External ear normal.  Left  Ear: External ear normal.  Nose: Nose normal.  Mouth/Throat: Oropharynx is clear and moist. No oropharyngeal exudate.  Eyes: Conjunctivae are normal. Pupils are equal, round, and reactive to light. Right eye exhibits no discharge. Left eye exhibits no discharge. No scleral icterus.  Neck: Normal range of motion. Neck supple. No tracheal deviation present. No thyromegaly present.  Cardiovascular: Normal rate, regular rhythm, normal heart sounds and intact distal pulses.  Exam reveals no gallop and no friction rub.   No murmur heard. Pulmonary/Chest: Effort normal and breath sounds normal. No respiratory distress. She has no wheezes. She has no rales. She exhibits no tenderness.    Musculoskeletal: Normal range of motion. She exhibits no edema or tenderness.  Lymphadenopathy:    She has no cervical adenopathy.  Neurological: She is alert and oriented to person, place, and time. No cranial nerve deficit. She exhibits normal muscle tone. Coordination normal.  Skin: Skin is warm and dry. No rash noted. She is not diaphoretic. No erythema. No pallor.  Psychiatric: She has a normal mood and affect. Her behavior is normal. Judgment and thought content normal.          Assessment & Plan:   Problem List Items Addressed This Visit      Unprioritized   Hypothyroidism following radioiodine therapy    Will check TSH with labs.      Relevant Medications   cyanocobalamin ((VITAMIN B-12)) injection 1,000 mcg (Completed) (Start on 12/27/2015  8:00 AM)   Rheumatoid arthritis flare (HCC) - Primary    Symptoms of recent flare improving. Will check labs including CBC, ESR, CRP, CMP today and forward to rheumatology. Continue current medications. Encouraged yearly eye exam on Plaquenil.      Relevant Medications   cyanocobalamin ((VITAMIN B-12)) injection 1,000 mcg (Completed) (Start on 12/27/2015  8:00 AM)   Other Relevant Orders   CBC with Differential/Platelet   Comprehensive metabolic panel   TSH   Sed Rate (ESR)   C-reactive protein   Skin lesion    Skin lesion over foot was biopsied by dermatology and reported as fungal infection. She was treated with Naftin with resolution.          Return in about 5 months (around 05/28/2016) for Recheck.  Ronette Deter, MD Internal [facility]

## 2015-12-29 ENCOUNTER — Telehealth: Payer: Self-pay | Admitting: Internal Medicine

## 2015-12-29 NOTE — Telephone Encounter (Signed)
error 

## 2016-01-08 ENCOUNTER — Ambulatory Visit: Payer: Self-pay

## 2016-01-29 ENCOUNTER — Encounter: Payer: Self-pay | Admitting: Internal Medicine

## 2016-01-30 ENCOUNTER — Other Ambulatory Visit: Payer: Self-pay | Admitting: Internal Medicine

## 2016-01-30 ENCOUNTER — Ambulatory Visit: Payer: Self-pay

## 2016-01-30 ENCOUNTER — Ambulatory Visit (INDEPENDENT_AMBULATORY_CARE_PROVIDER_SITE_OTHER): Payer: 59 | Admitting: Internal Medicine

## 2016-01-30 DIAGNOSIS — E538 Deficiency of other specified B group vitamins: Secondary | ICD-10-CM

## 2016-01-30 DIAGNOSIS — M069 Rheumatoid arthritis, unspecified: Secondary | ICD-10-CM

## 2016-01-30 MED ORDER — CYANOCOBALAMIN 1000 MCG/ML IJ SOLN
1000.0000 ug | Freq: Once | INTRAMUSCULAR | Status: AC
Start: 2016-01-30 — End: 2016-01-30
  Administered 2016-01-30: 1000 ug via INTRAMUSCULAR

## 2016-01-30 NOTE — Progress Notes (Signed)
Nurse visit

## 2016-01-31 ENCOUNTER — Other Ambulatory Visit: Payer: Self-pay | Admitting: Internal Medicine

## 2016-01-31 DIAGNOSIS — M069 Rheumatoid arthritis, unspecified: Secondary | ICD-10-CM

## 2016-01-31 MED ORDER — HYDROCODONE-ACETAMINOPHEN 5-325 MG PO TABS: 1.0000 | ORAL_TABLET | Freq: Three times a day (TID) | ORAL | Status: DC | PRN

## 2016-02-01 NOTE — Telephone Encounter (Signed)
Has already been taken care of.

## 2016-02-12 ENCOUNTER — Ambulatory Visit
Admission: RE | Admit: 2016-02-12 | Discharge: 2016-02-12 | Disposition: A | Discharge status: Home / Self Care | Payer: BC Managed Care – PPO | Source: Ambulatory Visit | Attending: Family | Admitting: Family

## 2016-02-12 ENCOUNTER — Ambulatory Visit (INDEPENDENT_AMBULATORY_CARE_PROVIDER_SITE_OTHER): Payer: BC Managed Care – PPO | Admitting: Family

## 2016-02-12 ENCOUNTER — Telehealth: Payer: Self-pay | Admitting: *Deleted

## 2016-02-12 ENCOUNTER — Encounter: Payer: Self-pay | Admitting: Internal Medicine

## 2016-02-12 ENCOUNTER — Encounter: Payer: Self-pay | Admitting: Family

## 2016-02-12 VITALS — BP 142/98 | HR 93 | Temp 98.1°F | Ht 71.0 in | Wt 239.6 lb

## 2016-02-12 DIAGNOSIS — R1012 Left upper quadrant pain: Secondary | ICD-10-CM

## 2016-02-12 DIAGNOSIS — R1011 Right upper quadrant pain: Secondary | ICD-10-CM | POA: Diagnosis not present

## 2016-02-12 DIAGNOSIS — D3501 Benign neoplasm of right adrenal gland: Secondary | ICD-10-CM | POA: Diagnosis not present

## 2016-02-12 MED ORDER — IOPAMIDOL (ISOVUE-300) INJECTION 61%
100.0000 mL | Freq: Once | INTRAVENOUS | Status: AC | PRN
  Administered 2016-02-12: 100 mL via INTRAVENOUS

## 2016-02-12 NOTE — Patient Instructions (Signed)
Pending work up. In the interim, if pain worsens, please go to ED. Unsure of etiology of pain at this time.   If there is no improvement in your symptoms, or if there is any worsening of symptoms, or if you have any additional concerns, please return for re-evaluation; or, if we are closed, consider going to the Emergency Room for evaluation if symptoms urgent.

## 2016-02-12 NOTE — Progress Notes (Signed)
Pre visit review using our clinic review tool, if applicable. No additional management support is needed unless otherwise documented below in the visit note. 

## 2016-02-12 NOTE — Telephone Encounter (Signed)
Pt signed UDS

## 2016-02-12 NOTE — Progress Notes (Signed)
Subjective:    Patient ID: [name], female    DOB: [birth year], 45 y.o.   MRN: [id number]   RANNAH HODGKINS is a 45 y.o. female who presents today for an acute visit.    HPI Comments: Patient here for evaluation of acute RUQ adbominal pain for 4 days. Waxes and waynes. Overall worsening. Describes as sharp, radiates from RUQ to right flank. Pain doesn't increase with movement, normal breaths, lateral side bends, eating. Pain improves with ibuprofen and vicodin. At first thought it was a pulled muscle. Now it is awakening her a night. No constipated, rash. H/o hysterectomy, cholestecomy after pancreatitis 30 years ago. No h/o diverticulitis, kidney stones. No fever, chills, abdominal distention, dysuria, decreased urine output.   Past Medical History  Diagnosis Date  . Depression   . Migraines   . GERD (gastroesophageal reflux disease)   . Allergy   . Hypertension   . Thyroid disease     Graves disease radioactive iodine 10/12  . Positive TB test   . Sleep apnea   . COPD (chronic obstructive pulmonary disease) (HCC)    Allergies: Inh and Zithromax Current Outpatient Prescriptions on File Prior to Visit  Medication Sig Dispense Refill  . acetaminophen (TYLENOL) 325 MG tablet Take 650 mg by mouth as needed.    Marland Kitchen albuterol (PROVENTIL HFA;VENTOLIN HFA) 108 (90 BASE) MCG/ACT inhaler Inhale 1-2 puffs into the lungs every 6 (six) hours as needed for wheezing or shortness of breath. 1 Inhaler 4  . albuterol (PROVENTIL HFA;VENTOLIN HFA) 108 (90 BASE) MCG/ACT inhaler Inhale 2 puffs into the lungs every 6 (six) hours as needed for wheezing or shortness of breath. 1 Inhaler 2  . Cholecalciferol (VITAMIN D-3 PO) Take 5,000 Units by mouth daily.     . clotrimazole-betamethasone (LOTRISONE) cream Apply 1 application topically 2 (two) times daily. 30 g 0  . DULoxetine (CYMBALTA) 60 MG capsule Take 90 mg by mouth daily.     . fluconazole (DIFLUCAN) 150 MG tablet Take 1 tablet (150 mg  total) by mouth once a week. 3 tablet 0  . fluticasone-salmeterol (ADVAIR HFA) 115-21 MCG/ACT inhaler Inhale 2 puffs into the lungs 2 (two) times daily. 1 Inhaler 12  . folic acid (FOLVITE) 1 MG tablet Take 2 mg by mouth daily.    Marland Kitchen HYDROcodone-acetaminophen (NORCO/VICODIN) 5-325 MG tablet Take 1-2 tablets by mouth 3 (three) times daily as needed for severe pain. 90 tablet 0  . hydroxychloroquine (PLAQUENIL) 200 MG tablet Take 400 mg by mouth.     Marland Kitchen ibuprofen (ADVIL,MOTRIN) 800 MG tablet Take by mouth.    . LamoTRIgine 50 MG TB24 Take 150 mg by mouth.     . levothyroxine (SYNTHROID, LEVOTHROID) 150 MCG tablet Take 1 tablet (150 mcg total) by mouth daily. 90 tablet 3  . Magnesium 100 MG CAPS Take 2 capsules by mouth 2 (two) times daily.    . methotrexate (RHEUMATREX) 2.5 MG tablet Take 25 mg by mouth once a week.    . pantoprazole (PROTONIX) 40 MG tablet TAKE 1 TABLET BY MOUTH 2 TIMES DAILY. 60 tablet 2  . predniSONE (DELTASONE) 5 MG tablet Take 5 mg by mouth daily.    . SUMAtriptan (IMITREX) 100 MG tablet Take 50 mg by mouth every 2 (two) hours as needed for migraine or headache. May repeat in 2 hours if headache persists or recurs.    . topiramate (TOPAMAX) 100 MG tablet Take 1 tablet (100 mg total) by mouth 2 (two) times  daily. 180 tablet 0  . traMADol (ULTRAM) 50 MG tablet Take 1-2 tablets (50-100 mg total) by mouth every 8 (eight) hours as needed for severe pain. 90 tablet 1   No current facility-administered medications on file prior to visit.    Social History  Substance Use Topics  . Smoking status: Former Smoker -- 2.00 packs/day for 20 years    Types: Cigarettes    Quit date: 08/05/2013  . Smokeless tobacco: Never Used  . Alcohol Use: No    Review of Systems  Constitutional: Negative for fever and chills.  Respiratory: Negative for cough.   Cardiovascular: Negative for chest pain and palpitations.  Gastrointestinal: Positive for abdominal pain. Negative for nausea, vomiting,  constipation, blood in stool and abdominal distention.  Genitourinary: Negative for dysuria, frequency and flank pain.      Objective:    BP 142/98 mmHg  Pulse 93  Temp(Src) 98.1 F (36.7 C)  Ht 5\' 11"  (1.803 m)  Wt 239 lb 9.6 oz (108.682 kg)  BMI 33.43 kg/m2  SpO2 97%   Physical Exam  Constitutional: She appears well-developed and well-nourished.  Eyes: Conjunctivae are normal.  Cardiovascular: Normal rate, regular rhythm, normal heart sounds and normal pulses.   Pulmonary/Chest: Effort normal and breath sounds normal. She has no wheezes. She has no rhonchi. She has no rales.  Abdominal: Soft. Normal appearance and bowel sounds are normal. She exhibits no distension, no fluid wave, no ascites and no mass. There is tenderness in the right upper quadrant and left lower quadrant. There is no rigidity, no rebound, no guarding and no CVA tenderness.  Neurological: She is alert.  Skin: Skin is warm and dry.  Psychiatric: She has a normal mood and affect. Her speech is normal and behavior is normal. Thought content normal.  Vitals reviewed.      Assessment & Plan:  1. LUQ and RUQ abdominal pain Patient is well appearing and in no acute distress. Vitals WNL. Pain is nonspecific at this time. Pending labs and CT abdomen to evaluate for liver etiology, kidney stones, and diverticulitis.    - CT Abdomen Pelvis W Contrast; Future - Lipase; Future - Comprehensive metabolic panel; Future - CBC with Differential/Platelet; Future - Urinalysis, Routine w reflex microscopic (not at [facility]); Future - Lipase - Comprehensive metabolic panel - CBC with Differential/Platelet - Urinalysis, Routine w reflex microscopic (not at [facility])     I am having Ms. Uffelman maintain her Magnesium, SUMAtriptan, levothyroxine, ibuprofen, acetaminophen, Cholecalciferol (VITAMIN D-3 PO), DULoxetine, hydroxychloroquine, LamoTRIgine, topiramate, albuterol, fluticasone-salmeterol, albuterol, traMADol,  clotrimazole-betamethasone, methotrexate, folic acid, predniSONE, fluconazole, pantoprazole, HYDROcodone-acetaminophen, econazole nitrate, LamoTRIgine XR, methotrexate, and NAFTIN.   Meds ordered this encounter  Medications  . econazole nitrate 1 % cream    Sig:     Refill:  2  . LamoTRIgine XR 200 MG TB24    Sig:     Refill:  0  . methotrexate (RHEUMATREX) 2.5 MG tablet    Sig:     Refill:  5  . NAFTIN 2 % GEL    Sig:     Refill:  2    Return precautions given.   Start medications as prescribed and explained to patient on After Visit Summary ( AVS). Risks, benefits, and alternatives of the medications and treatment plan prescribed today were discussed, and patient expressed understanding.   Education regarding symptom management and diagnosis given to patient.   Follow-up:Plan follow-up and return precautions given if any worsening symptoms or change in condition.  Continue to follow with Rica Mast, MD for routine health maintenance.   [name] and I agreed with plan.   Mable Paris, FNP

## 2016-02-13 ENCOUNTER — Telehealth: Payer: Self-pay

## 2016-02-13 LAB — CBC WITH DIFFERENTIAL/PLATELET
BASOS PCT: 0.7 % (ref 0.0–3.0)
Basophils Absolute: 0.1 10*3/uL (ref 0.0–0.1)
EOS PCT: 1.6 % (ref 0.0–5.0)
Eosinophils Absolute: 0.1 10*3/uL (ref 0.0–0.7)
HEMATOCRIT: 39.3 % (ref 36.0–46.0)
HEMOGLOBIN: 13.3 g/dL (ref 12.0–15.0)
LYMPHS PCT: 34.2 % (ref 12.0–46.0)
Lymphs Abs: 2.7 10*3/uL (ref 0.7–4.0)
MCHC: 34 g/dL (ref 30.0–36.0)
MCV: 90.1 fl (ref 78.0–100.0)
MONOS PCT: 7.8 % (ref 3.0–12.0)
Monocytes Absolute: 0.6 10*3/uL (ref 0.1–1.0)
Neutro Abs: 4.4 10*3/uL (ref 1.4–7.7)
Neutrophils Relative %: 55.7 % (ref 43.0–77.0)
Platelets: 361 10*3/uL (ref 150.0–400.0)
RBC: 4.36 Mil/uL (ref 3.87–5.11)
RDW: 14.5 % (ref 11.5–15.5)
WBC: 7.9 10*3/uL (ref 4.0–10.5)

## 2016-02-13 LAB — COMPREHENSIVE METABOLIC PANEL
ALBUMIN: 4.3 g/dL (ref 3.5–5.2)
ALK PHOS: 74 U/L (ref 39–117)
ALT: 14 U/L (ref 0–35)
AST: 14 U/L (ref 0–37)
BUN: 8 mg/dL (ref 6–23)
CHLORIDE: 106 meq/L (ref 96–112)
CO2: 27 mEq/L (ref 19–32)
Calcium: 9.7 mg/dL (ref 8.4–10.5)
Creatinine, Ser: 0.75 mg/dL (ref 0.40–1.20)
GFR: 88.13 mL/min (ref >60.00)
Glucose, Bld: 88 mg/dL (ref 70–99)
POTASSIUM: 4.2 meq/L (ref 3.5–5.1)
SODIUM: 138 meq/L (ref 135–145)
TOTAL PROTEIN: 7.9 g/dL (ref 6.0–8.3)
Total Bilirubin: 0.4 mg/dL (ref 0.2–1.2)

## 2016-02-13 LAB — URINALYSIS, ROUTINE W REFLEX MICROSCOPIC
Bilirubin Urine: NEGATIVE
HGB URINE DIPSTICK: NEGATIVE
Ketones, ur: NEGATIVE
Leukocytes, UA: NEGATIVE
NITRITE: NEGATIVE
SPECIFIC GRAVITY, URINE: 1.015 (ref 1.000–1.030)
Total Protein, Urine: NEGATIVE
URINE GLUCOSE: NEGATIVE
Urobilinogen, UA: 0.2 (ref 0.0–1.0)
pH: 6 (ref 5.0–8.0)

## 2016-02-13 LAB — LIPASE: Lipase: 13 U/L (ref 11.0–59.0)

## 2016-02-13 NOTE — Telephone Encounter (Signed)
-----   Message from Burnard Hawthorne, [place] sent at 02/13/2016 10:16 AM EDT ----- Please let patient know that recent tests were normal.  Hope side pain is improving...   If unable to reach patient after 3 attempts, please let me know.   Thanks as always!  margaret

## 2016-02-13 NOTE — Telephone Encounter (Deleted)
-----   Message from Burnard Hawthorne, [place] sent at 02/13/2016 10:16 AM EDT ----- Please let patient know that recent tests were normal.  Hope side pain is improving...   If unable to reach patient after 3 attempts, please let me know.   Thanks as always!  margaret

## 2016-02-13 NOTE — Telephone Encounter (Signed)
Called patient to give lab results. No answer. 

## 2016-02-13 NOTE — Telephone Encounter (Signed)
Called patient from cell phone at 7pm 02/12/16 to let her know there are no acute findings on CT abdomen. Right renal adenoma. Pending lab results at this time. Working diagnosis of MS etiology.

## 2016-02-13 NOTE — Telephone Encounter (Signed)
Called patient. Gave lab results. Patient verbalized understanding.  

## 2016-02-15 ENCOUNTER — Ambulatory Visit: Payer: Self-pay | Admitting: Internal Medicine

## 2016-02-23 DIAGNOSIS — F411 Generalized anxiety disorder: Secondary | ICD-10-CM | POA: Diagnosis not present

## 2016-02-23 DIAGNOSIS — F39 Unspecified mood [affective] disorder: Secondary | ICD-10-CM | POA: Diagnosis not present

## 2016-03-04 ENCOUNTER — Encounter: Payer: Self-pay | Admitting: Family

## 2016-03-04 ENCOUNTER — Encounter: Payer: Self-pay | Admitting: Internal Medicine

## 2016-03-04 ENCOUNTER — Ambulatory Visit (INDEPENDENT_AMBULATORY_CARE_PROVIDER_SITE_OTHER): Payer: BC Managed Care – PPO | Admitting: Family

## 2016-03-04 VITALS — BP 130/66 | HR 94 | Temp 97.6°F | Wt 240.4 lb

## 2016-03-04 DIAGNOSIS — W57XXXA Bitten or stung by nonvenomous insect and other nonvenomous arthropods, initial encounter: Secondary | ICD-10-CM | POA: Diagnosis not present

## 2016-03-04 DIAGNOSIS — T148 Other injury of unspecified body region: Secondary | ICD-10-CM | POA: Diagnosis not present

## 2016-03-04 MED ORDER — CEPHALEXIN 500 MG PO CAPS: 500.0000 mg | ORAL_CAPSULE | Freq: Four times a day (QID) | ORAL | 0 refills | Status: DC

## 2016-03-04 NOTE — Progress Notes (Signed)
Subjective:    Patient ID: [name], female    DOB: 23-Mar-1971, 45 y.o.   MRN: [id number]  CC: [name] is a 45 y.o. female who presents today for an acute visit.    HPI: Patient here for evaluation of several whelps in between thighs which started 3 days ago, worsening. Started after sitting outside when she noticed itching. Describes them as itchy. No fever, chills. No history of MRSA or cellulitis. Has had similar whelps from mosquito bites. Draining clear discharge with surrounding redness.   RA ; on methotrexate and steroids.    HISTORY:  Past Medical History:  Diagnosis Date  . Allergy   . COPD (chronic obstructive pulmonary disease) ([place])   . Depression   . GERD (gastroesophageal reflux disease)   . Hypertension   . Migraines   . Positive TB test   . Sleep apnea   . Thyroid disease    Graves disease radioactive iodine 10/12   Past Surgical History:  Procedure Laterality Date  . ABDOMINAL HYSTERECTOMY  2013   and BSO  . ANKLE SURGERY  08/06/2007   left x2  . BILATERAL SALPINGOOPHORECTOMY  2013  . BREAST SURGERY Left 2013   biopsy-benign  . CHOLECYSTECTOMY  1990  . ganglion cyst removal  08/06/2003   left wrist  . TUBAL LIGATION  2000   Family History  Problem Relation Age of Onset  . Cancer Mother 20    fallopian tube  . Arthritis Mother   . Heart disease Mother   . Diabetes Mother   . Hypertension Mother   . Cancer Maternal Aunt     Breast CA  . Diabetes Maternal Grandmother   . Diabetes Maternal Grandfather   . Diabetes Paternal Grandmother   . Diabetes Paternal Grandfather   . Coronary artery disease Father     Allergies: Inh [isoniazid] and Zithromax [azithromycin] Current Outpatient Prescriptions on File Prior to Visit  Medication Sig Dispense Refill  . acetaminophen (TYLENOL) 325 MG tablet Take 650 mg by mouth as needed.    Marland Kitchen albuterol (PROVENTIL HFA;VENTOLIN HFA) 108 (90 BASE) MCG/ACT inhaler Inhale 1-2 puffs into the lungs  every 6 (six) hours as needed for wheezing or shortness of breath. 1 Inhaler 4  . albuterol (PROVENTIL HFA;VENTOLIN HFA) 108 (90 BASE) MCG/ACT inhaler Inhale 2 puffs into the lungs every 6 (six) hours as needed for wheezing or shortness of breath. 1 Inhaler 2  . Cholecalciferol (VITAMIN D-3 PO) Take 5,000 Units by mouth daily.     . clotrimazole-betamethasone (LOTRISONE) cream Apply 1 application topically 2 (two) times daily. 30 g 0  . DULoxetine (CYMBALTA) 60 MG capsule Take 90 mg by mouth daily.     Marland Kitchen econazole nitrate 1 % cream   2  . fluconazole (DIFLUCAN) 150 MG tablet Take 1 tablet (150 mg total) by mouth once a week. 3 tablet 0  . fluticasone-salmeterol (ADVAIR HFA) 115-21 MCG/ACT inhaler Inhale 2 puffs into the lungs 2 (two) times daily. 1 Inhaler 12  . folic acid (FOLVITE) 1 MG tablet Take 2 mg by mouth daily.    Marland Kitchen HYDROcodone-acetaminophen (NORCO/VICODIN) 5-325 MG tablet Take 1-2 tablets by mouth 3 (three) times daily as needed for severe pain. 90 tablet 0  . ibuprofen (ADVIL,MOTRIN) 800 MG tablet Take by mouth.    . LamoTRIgine 50 MG TB24 Take 150 mg by mouth.     . LamoTRIgine XR 200 MG TB24   0  . levothyroxine (SYNTHROID, LEVOTHROID) 150  MCG tablet Take 1 tablet (150 mcg total) by mouth daily. 90 tablet 3  . Magnesium 100 MG CAPS Take 2 capsules by mouth 2 (two) times daily.    . methotrexate (RHEUMATREX) 2.5 MG tablet Take 25 mg by mouth once a week.    . methotrexate (RHEUMATREX) 2.5 MG tablet   5  . NAFTIN 2 % GEL   2  . pantoprazole (PROTONIX) 40 MG tablet TAKE 1 TABLET BY MOUTH 2 TIMES DAILY. 60 tablet 2  . predniSONE (DELTASONE) 5 MG tablet Take 5 mg by mouth daily.    . SUMAtriptan (IMITREX) 100 MG tablet Take 50 mg by mouth every 2 (two) hours as needed for migraine or headache. May repeat in 2 hours if headache persists or recurs.    . topiramate (TOPAMAX) 100 MG tablet Take 1 tablet (100 mg total) by mouth 2 (two) times daily. 180 tablet 0  . traMADol (ULTRAM) 50 MG  tablet Take 1-2 tablets (50-100 mg total) by mouth every 8 (eight) hours as needed for severe pain. 90 tablet 1   No current facility-administered medications on file prior to visit.     Social History  Substance Use Topics  . Smoking status: Former Smoker    Packs/day: 2.00    Years: 20.00    Types: Cigarettes    Quit date: 08/05/2013  . Smokeless tobacco: Never Used  . Alcohol use No    Review of Systems  Constitutional: Negative for chills and fever.  Respiratory: Negative for cough.   Cardiovascular: Negative for chest pain and palpitations.  Gastrointestinal: Negative for nausea and vomiting.  Skin: Positive for rash.      Objective:    BP 130/66 (BP Location: Left Arm, Patient Position: Sitting, Cuff Size: Large)   Pulse 94   Temp 97.6 F (36.4 C)   Wt 240 lb 6.4 oz (109 kg)   SpO2 98%   BMI 33.53 kg/m    Physical Exam  Constitutional: She appears well-developed and well-nourished.  Eyes: Conjunctivae are normal.  Cardiovascular: Normal rate, regular rhythm, normal heart sounds and normal pulses.   Pulmonary/Chest: Effort normal and breath sounds normal. She has no wheezes. She has no rhonchi. She has no rales.  Neurological: She is alert.  Skin: Skin is warm and dry. Rash noted. Rash is papular, nodular and urticarial.  Several excoriated whelps with nodular, firm base, surrounding erythema bilateral medial thighs. No increase warmth. Clear filled papules.   Psychiatric: She has a normal mood and affect. Her speech is normal and behavior is normal. Thought content normal.  Vitals reviewed.      Assessment & Plan:  1. Insect bites Suspect staph infection from intense excoriation. No systemic features.   - cephALEXin (KEFLEX) 500 MG capsule; Take 1 capsule (500 mg total) by mouth every 6 (six) hours.  Dispense: 28 capsule; Refill: 0  Patient will establish care with me and follow up for annual physical in 2-4 months.    I am having [name] start on  cephALEXin. I am also having her maintain her Magnesium, SUMAtriptan, levothyroxine, ibuprofen, acetaminophen, Cholecalciferol (VITAMIN D-3 PO), DULoxetine, LamoTRIgine, topiramate, albuterol, fluticasone-salmeterol, albuterol, traMADol, clotrimazole-betamethasone, methotrexate, folic acid, predniSONE, fluconazole, pantoprazole, HYDROcodone-acetaminophen, econazole nitrate, LamoTRIgine XR, methotrexate, and NAFTIN.   Meds ordered this encounter  Medications  . cephALEXin (KEFLEX) 500 MG capsule    Sig: Take 1 capsule (500 mg total) by mouth every 6 (six) hours.    Dispense:  28 capsule    Refill:  0    Order Specific Question:   Supervising Provider    Answer:   Cassandria Anger [[id number]]    Return precautions given.   Risks, benefits, and alternatives of the medications and treatment plan prescribed today were discussed, and patient expressed understanding.   Education regarding symptom management and diagnosis given to patient on AVS.  Continue to follow with Rica Mast, MD for routine health maintenance.   [name] and I agreed with plan.   Mable Paris, FNP

## 2016-03-04 NOTE — Telephone Encounter (Signed)
Patient has been scheduled with Mable Paris FNP @1100 

## 2016-03-04 NOTE — Patient Instructions (Signed)
Pleasure meeting you and look forward to seeing you again and establishing care.   Benadryl as needed for itching at bedtime.  If there is no improvement in your symptoms, or if there is any worsening of symptoms, or if you have any additional concerns, please return for re-evaluation; or, if we are closed, consider going to the Emergency Room for evaluation if symptoms urgent.

## 2016-03-04 NOTE — Progress Notes (Signed)
Pre visit review using our clinic review tool, if applicable. No additional management support is needed unless otherwise documented below in the visit note. 

## 2016-03-08 ENCOUNTER — Encounter: Payer: Self-pay | Admitting: Internal Medicine

## 2016-03-08 ENCOUNTER — Ambulatory Visit (INDEPENDENT_AMBULATORY_CARE_PROVIDER_SITE_OTHER): Payer: BC Managed Care – PPO | Admitting: Internal Medicine

## 2016-03-08 VITALS — BP 120/68 | HR 100 | Ht 71.0 in | Wt 239.6 lb

## 2016-03-08 DIAGNOSIS — L989 Disorder of the skin and subcutaneous tissue, unspecified: Secondary | ICD-10-CM

## 2016-03-08 DIAGNOSIS — M069 Rheumatoid arthritis, unspecified: Secondary | ICD-10-CM | POA: Diagnosis not present

## 2016-03-08 LAB — COMPREHENSIVE METABOLIC PANEL
ALT: 13 U/L (ref 0–35)
AST: 12 U/L (ref 0–37)
Albumin: 4.1 g/dL (ref 3.5–5.2)
Alkaline Phosphatase: 76 U/L (ref 39–117)
BUN: 8 mg/dL (ref 6–23)
CALCIUM: 9.5 mg/dL (ref 8.4–10.5)
CHLORIDE: 108 meq/L (ref 96–112)
CO2: 29 meq/L (ref 19–32)
CREATININE: 0.73 mg/dL (ref 0.40–1.20)
GFR: 90.89 mL/min (ref >60.00)
Glucose, Bld: 87 mg/dL (ref 70–99)
Potassium: 4.4 mEq/L (ref 3.5–5.1)
Sodium: 144 mEq/L (ref 135–145)
Total Bilirubin: 0.2 mg/dL (ref 0.2–1.2)
Total Protein: 7.8 g/dL (ref 6.0–8.3)

## 2016-03-08 LAB — CBC WITH DIFFERENTIAL/PLATELET
BASOS ABS: 0 10*3/uL (ref 0.0–0.1)
Basophils Relative: 0.6 % (ref 0.0–3.0)
EOS ABS: 0.1 10*3/uL (ref 0.0–0.7)
Eosinophils Relative: 1.4 % (ref 0.0–5.0)
HEMATOCRIT: 38.9 % (ref 36.0–46.0)
Hemoglobin: 13.2 g/dL (ref 12.0–15.0)
LYMPHS PCT: 18.9 % (ref 12.0–46.0)
Lymphs Abs: 1.3 10*3/uL (ref 0.7–4.0)
MCHC: 34 g/dL (ref 30.0–36.0)
MCV: 90.2 fl (ref 78.0–100.0)
MONOS PCT: 8.5 % (ref 3.0–12.0)
Monocytes Absolute: 0.6 10*3/uL (ref 0.1–1.0)
NEUTROS ABS: 4.9 10*3/uL (ref 1.4–7.7)
Neutrophils Relative %: 70.6 % (ref 43.0–77.0)
PLATELETS: 318 10*3/uL (ref 150.0–400.0)
RBC: 4.31 Mil/uL (ref 3.87–5.11)
RDW: 14 % (ref 11.5–15.5)
WBC: 6.9 10*3/uL (ref 4.0–10.5)

## 2016-03-08 LAB — SEDIMENTATION RATE: Sed Rate: 10 mm/hr (ref 0–20)

## 2016-03-08 LAB — C-REACTIVE PROTEIN: CRP: 0.5 mg/dL (ref 0.5–20.0)

## 2016-03-08 NOTE — Patient Instructions (Addendum)
Finish out course of Keflex.  Labs today.  Follow up in 4 weeks.

## 2016-03-08 NOTE — Assessment & Plan Note (Signed)
Symptomatically, doing well. Will do labs today. Follow up with rheumatology as scheduled.

## 2016-03-08 NOTE — Progress Notes (Signed)
Pre visit review using our clinic review tool, if applicable. No additional management support is needed unless otherwise documented below in the visit note. 

## 2016-03-08 NOTE — Progress Notes (Signed)
Subjective:    Patient ID: [name], female    DOB: 18-Mar-1971, 45 y.o.   MRN: [id number]  HPI  45YO female presents for follow up.  Last seen in 12/2015 for RA flare. Also seen by Mable Paris for insect bites on 7/31. Treated with Keflex.  Lesions are much improved. No fever, chills. Some diarrhea with Keflex.  RA symptoms generally well controlled. Would like to do labs today prior to follow up with rheumatology.   Wt Readings from Last 3 Encounters:  03/08/16 239 lb 9.6 oz (108.7 kg)  03/04/16 240 lb 6.4 oz (109 kg)  02/12/16 239 lb 9.6 oz (108.7 kg)   BP Readings from Last 3 Encounters:  03/08/16 120/68  03/04/16 130/66  02/12/16 (!) 142/98    Past Medical History:  Diagnosis Date  . Allergy   . COPD (chronic obstructive pulmonary disease) ([place])   . Depression   . GERD (gastroesophageal reflux disease)   . Hypertension   . Migraines   . Positive TB test   . Sleep apnea   . Thyroid disease    Graves disease radioactive iodine 10/12   Family History  Problem Relation Age of Onset  . Cancer Mother 17    fallopian tube  . Arthritis Mother   . Heart disease Mother   . Diabetes Mother   . Hypertension Mother   . Cancer Maternal Aunt     Breast CA  . Diabetes Maternal Grandmother   . Diabetes Maternal Grandfather   . Diabetes Paternal Grandmother   . Diabetes Paternal Grandfather   . Coronary artery disease Father    Past Surgical History:  Procedure Laterality Date  . ABDOMINAL HYSTERECTOMY  2013   and BSO  . ANKLE SURGERY  08/06/2007   left x2  . BILATERAL SALPINGOOPHORECTOMY  2013  . BREAST SURGERY Left 2013   biopsy-benign  . CHOLECYSTECTOMY  1990  . ganglion cyst removal  08/06/2003   left wrist  . TUBAL LIGATION  2000   Social History   Social History  . Marital status: Married    Spouse name: N/A  . Number of children: N/A  . Years of education: N/A   Social History Main Topics  . Smoking status: Former Smoker   Packs/day: 2.00    Years: 20.00    Types: Cigarettes    Quit date: 08/05/2013  . Smokeless tobacco: Never Used  . Alcohol use No  . Drug use: No  . Sexual activity: Yes    Birth control/ protection: Surgical   Other Topics Concern  . None   Social History Narrative  . None    Review of Systems  Constitutional: Negative for appetite change, chills, fatigue, fever and unexpected weight change.  Eyes: Negative for visual disturbance.  Respiratory: Negative for shortness of breath.   Cardiovascular: Negative for chest pain and leg swelling.  Gastrointestinal: Negative for abdominal pain.  Musculoskeletal: Negative for arthralgias and myalgias.  Skin: Positive for color change and rash.  Hematological: Negative for adenopathy. Does not bruise/bleed easily.  Psychiatric/Behavioral: Negative for dysphoric mood. The patient is not nervous/anxious.        Objective:    BP 120/68 (BP Location: Left Arm, Patient Position: Sitting, Cuff Size: Large)   Pulse 100   Ht '5\' 11"'  (1.803 m)   Wt 239 lb 9.6 oz (108.7 kg)   SpO2 98%   BMI 33.42 kg/m  Physical Exam  Constitutional: She is oriented to person, place, and time.  She appears well-developed and well-nourished. No distress.  HENT:  Head: Normocephalic and atraumatic.  Right Ear: External ear normal.  Left Ear: External ear normal.  Nose: Nose normal.  Mouth/Throat: Oropharynx is clear and moist. No oropharyngeal exudate.  Eyes: Conjunctivae are normal. Pupils are equal, round, and reactive to light. Right eye exhibits no discharge. Left eye exhibits no discharge. No scleral icterus.  Neck: Normal range of motion. Neck supple. No tracheal deviation present. No thyromegaly present.  Cardiovascular: Normal rate, regular rhythm, normal heart sounds and intact distal pulses.  Exam reveals no gallop and no friction rub.   No murmur heard. Pulmonary/Chest: Effort normal and breath sounds normal. No respiratory distress. She has no  wheezes. She has no rales. She exhibits no tenderness.  Musculoskeletal: Normal range of motion. She exhibits no edema or tenderness.  Lymphadenopathy:    She has no cervical adenopathy.  Neurological: She is alert and oriented to person, place, and time. No cranial nerve deficit. She exhibits normal muscle tone. Coordination normal.  Skin: Skin is warm and dry. Rash noted. She is not diaphoretic. There is erythema. No pallor.     Psychiatric: She has a normal mood and affect. Her behavior is normal. Judgment and thought content normal.          Assessment & Plan:   Problem List Items Addressed This Visit      Unprioritized   Rheumatoid arthritis ([place])    Symptomatically, doing well. Will do labs today. Follow up with rheumatology as scheduled.      Relevant Orders   CBC with Differential/Platelet   Comprehensive metabolic panel   Sed Rate (ESR)   C-reactive protein   Skin lesion - Primary    Lesions improving. Likely insect bites with secondary cellulitis. Responded well to Keflex. Will finish course of Keflex. Follow up prn.       Other Visit Diagnoses   None.      Return in about 4 weeks (around 04/05/2016) for New Patient.  Ronette Deter, MD Internal Medicine [facility]

## 2016-03-08 NOTE — Assessment & Plan Note (Signed)
Lesions improving. Likely insect bites with secondary cellulitis. Responded well to Keflex. Will finish course of Keflex. Follow up prn.

## 2016-04-25 ENCOUNTER — Telehealth: Payer: Self-pay | Admitting: Internal Medicine

## 2016-04-25 NOTE — Telephone Encounter (Signed)
Left pt vm to call office to sch flu shot.

## 2016-05-12 ENCOUNTER — Encounter: Payer: Self-pay | Admitting: Family

## 2016-05-14 ENCOUNTER — Other Ambulatory Visit: Payer: Self-pay

## 2016-05-14 MED ORDER — PANTOPRAZOLE SODIUM 40 MG PO TBEC: 40.0000 mg | DELAYED_RELEASE_TABLET | Freq: Two times a day (BID) | ORAL | 1 refills | Status: AC

## 2016-05-14 NOTE — Telephone Encounter (Signed)
Medication has been refilled.

## 2016-05-28 ENCOUNTER — Ambulatory Visit: Payer: Self-pay | Admitting: Internal Medicine

## 2016-06-10 ENCOUNTER — Other Ambulatory Visit: Payer: Self-pay | Admitting: Obstetrics and Gynecology

## 2016-06-10 ENCOUNTER — Encounter: Payer: BC Managed Care – PPO | Admitting: Family

## 2016-06-10 ENCOUNTER — Encounter: Payer: Self-pay | Admitting: Family

## 2016-06-10 DIAGNOSIS — Z1231 Encounter for screening mammogram for malignant neoplasm of breast: Secondary | ICD-10-CM

## 2016-06-10 NOTE — Progress Notes (Signed)
Pre visit review using our clinic review tool, if applicable. No additional management support is needed unless otherwise documented below in the visit note. 

## 2016-06-20 NOTE — Progress Notes (Signed)
This encounter was created in error - please disregard.

## 2016-07-24 ENCOUNTER — Ambulatory Visit
Admission: RE | Admit: 2016-07-24 | Discharge: 2016-07-24 | Disposition: A | Discharge status: Home / Self Care | Payer: BC Managed Care – PPO | Source: Ambulatory Visit | Attending: Obstetrics and Gynecology | Admitting: Obstetrics and Gynecology

## 2016-07-24 DIAGNOSIS — Z1231 Encounter for screening mammogram for malignant neoplasm of breast: Secondary | ICD-10-CM | POA: Diagnosis not present

## 2016-09-06 ENCOUNTER — Encounter: Payer: 59 | Admitting: Obstetrics and Gynecology

## 2016-11-15 ENCOUNTER — Encounter: Payer: 59 | Admitting: Obstetrics and Gynecology

## 2017-01-17 ENCOUNTER — Emergency Department: Payer: BC Managed Care – PPO

## 2017-01-17 ENCOUNTER — Other Ambulatory Visit: Payer: Self-pay

## 2017-01-17 ENCOUNTER — Emergency Department
Admission: EM | Admit: 2017-01-17 | Discharge: 2017-01-18 | Disposition: A | Discharge status: Home / Self Care | Payer: BC Managed Care – PPO | Attending: Emergency Medicine | Admitting: Emergency Medicine

## 2017-01-17 DIAGNOSIS — I10 Essential (primary) hypertension: Secondary | ICD-10-CM | POA: Diagnosis not present

## 2017-01-17 DIAGNOSIS — Z7982 Long term (current) use of aspirin: Secondary | ICD-10-CM | POA: Insufficient documentation

## 2017-01-17 DIAGNOSIS — J069 Acute upper respiratory infection, unspecified: Secondary | ICD-10-CM | POA: Diagnosis not present

## 2017-01-17 DIAGNOSIS — J441 Chronic obstructive pulmonary disease with (acute) exacerbation: Secondary | ICD-10-CM | POA: Insufficient documentation

## 2017-01-17 DIAGNOSIS — R05 Cough: Secondary | ICD-10-CM

## 2017-01-17 DIAGNOSIS — E890 Postprocedural hypothyroidism: Secondary | ICD-10-CM | POA: Diagnosis not present

## 2017-01-17 DIAGNOSIS — R0602 Shortness of breath: Secondary | ICD-10-CM | POA: Diagnosis present

## 2017-01-17 DIAGNOSIS — R059 Cough, unspecified: Secondary | ICD-10-CM

## 2017-01-17 DIAGNOSIS — Z79899 Other long term (current) drug therapy: Secondary | ICD-10-CM | POA: Diagnosis not present

## 2017-01-17 DIAGNOSIS — F17210 Nicotine dependence, cigarettes, uncomplicated: Secondary | ICD-10-CM | POA: Diagnosis not present

## 2017-01-17 DIAGNOSIS — Z791 Long term (current) use of non-steroidal anti-inflammatories (NSAID): Secondary | ICD-10-CM | POA: Diagnosis not present

## 2017-01-17 HISTORY — DX: Reserved for concepts with insufficient information to code with codable children: IMO0002

## 2017-01-17 HISTORY — DX: Sjogren syndrome, unspecified: M35.00

## 2017-01-17 HISTORY — DX: Systemic lupus erythematosus, unspecified: M32.9

## 2017-01-17 LAB — CBC WITH DIFFERENTIAL/PLATELET
BASOS PCT: 1 %
Basophils Absolute: 0.1 10*3/uL (ref 0–0.1)
EOS ABS: 0.1 10*3/uL (ref 0–0.7)
Eosinophils Relative: 1 %
HCT: 36.4 % (ref 35.0–47.0)
Hemoglobin: 12.3 g/dL (ref 12.0–16.0)
Lymphocytes Relative: 11 %
Lymphs Abs: 1.3 10*3/uL (ref 1.0–3.6)
MCH: 30.2 pg (ref 26.0–34.0)
MCHC: 33.8 g/dL (ref 32.0–36.0)
MCV: 89.4 fL (ref 80.0–100.0)
MONO ABS: 0.9 10*3/uL (ref 0.2–0.9)
MONOS PCT: 8 %
NEUTROS PCT: 79 %
Neutro Abs: 9.3 10*3/uL — ABNORMAL HIGH (ref 1.4–6.5)
PLATELETS: 299 10*3/uL (ref 150–440)
RBC: 4.08 MIL/uL (ref 3.80–5.20)
RDW: 14.3 % (ref 11.5–14.5)
WBC: 11.7 10*3/uL — ABNORMAL HIGH (ref 3.6–11.0)

## 2017-01-17 LAB — COMPREHENSIVE METABOLIC PANEL
ALBUMIN: 4.2 g/dL (ref 3.5–5.0)
ALT: 12 U/L — ABNORMAL LOW (ref 14–54)
ANION GAP: 8 (ref 5–15)
AST: 20 U/L (ref 15–41)
Alkaline Phosphatase: 86 U/L (ref 38–126)
BUN: 10 mg/dL (ref 6–20)
CO2: 23 mmol/L (ref 22–32)
Calcium: 8.8 mg/dL — ABNORMAL LOW (ref 8.9–10.3)
Chloride: 104 mmol/L (ref 101–111)
Creatinine, Ser: 0.68 mg/dL (ref 0.44–1.00)
GFR calc Af Amer: >60 mL/min (ref >60)
GFR calc non Af Amer: >60 mL/min (ref >60)
Glucose, Bld: 101 mg/dL — ABNORMAL HIGH (ref 65–99)
POTASSIUM: 3.6 mmol/L (ref 3.5–5.1)
SODIUM: 135 mmol/L (ref 135–145)
Total Bilirubin: 0.4 mg/dL (ref 0.3–1.2)
Total Protein: 7.8 g/dL (ref 6.5–8.1)

## 2017-01-17 LAB — LACTIC ACID, PLASMA: Lactic Acid, Venous: 1.5 mmol/L (ref 0.5–1.9)

## 2017-01-17 LAB — TROPONIN I: Troponin I: <0.03 ng/mL (ref <0.03)

## 2017-01-17 MED ORDER — LEVOFLOXACIN IN D5W 750 MG/150ML IV SOLN
750.0000 mg | Freq: Once | INTRAVENOUS | Status: AC
  Administered 2017-01-17: 750 mg via INTRAVENOUS
  Filled 2017-01-17: qty 150

## 2017-01-17 MED ORDER — IPRATROPIUM-ALBUTEROL 0.5-2.5 (3) MG/3ML IN SOLN
3.0000 mL | Freq: Once | RESPIRATORY_TRACT | Status: AC
  Administered 2017-01-17: 3 mL via RESPIRATORY_TRACT
  Filled 2017-01-17: qty 3

## 2017-01-17 MED ORDER — ALBUTEROL SULFATE (2.5 MG/3ML) 0.083% IN NEBU
5.0000 mg | INHALATION_SOLUTION | Freq: Once | RESPIRATORY_TRACT | Status: AC
  Administered 2017-01-17: 5 mg via RESPIRATORY_TRACT
  Filled 2017-01-17: qty 6

## 2017-01-17 MED ORDER — SODIUM CHLORIDE 0.9 % IV BOLUS (SEPSIS)
1000.0000 mL | Freq: Once | INTRAVENOUS | Status: AC
  Administered 2017-01-17: 1000 mL via INTRAVENOUS

## 2017-01-17 NOTE — ED Provider Notes (Signed)
[facility] Emergency Department Provider Note   ____________________________________________   First MD Initiated Contact with Patient 01/17/17 2311     (approximate)  I have reviewed the triage vital signs and the nursing notes.   HISTORY  Chief Complaint Fever and Shortness of Breath    HPI [name] is a 46 y.o. female who comes into the hospital today with fever and wheezing and shortness of breath. The patient went to see her primary care physician today and was told to come to the ER. She states that she was tachycardic and wheezing with a fever to 101. The patient states that she is immunosuppressed after taking medications for R Ray lupus and Sjogren's disease. The patient had some bodyaches nasal congestion and cough. She reports that she has some shortness of breath due to her wheezing and she has an inhaler at home. She reports that her symptoms started about 10 days ago. She thought maybe was allergies and [place]. She reports that it has really been bad the last 4-5 days. She got to the point today where she said she's been up all night coughing and drinking NyQuil and Hall's. The patient reports that her doctor didn't do anything for her and sent her straight to the emergency department. The patient is here today for evaluation.   Past Medical History:  Diagnosis Date  . Allergy   . COPD (chronic obstructive pulmonary disease) ([place])   . Depression   . GERD (gastroesophageal reflux disease)   . Hypertension   . Lupus   . Migraines   . Positive TB test   . Sjoegren syndrome ([place])   . Sleep apnea   . Thyroid disease    Graves disease radioactive iodine 10/12    Patient Active Problem List   Diagnosis Date Noted  . Candidiasis of skin 11/29/2015  . Skin lesion 11/29/2015  . Rheumatoid arthritis ([place]) 08/11/2015  . COPD (chronic obstructive pulmonary disease) ([place]) 04/28/2015  . B12 deficiency 04/28/2015  . Obesity  (BMI 30-39.9) 03/21/2015  . Chronic fatigue 10/17/2014  . Arthralgia 09/05/2014  . Major depressive disorder, recurrent episode ([place]) 09/05/2014  . Vitamin D deficiency 06/02/2014  . Hyperlipidemia 06/02/2014  . BPPV (benign paroxysmal positional vertigo) 08/25/2013  . GERD (gastroesophageal reflux disease) 08/12/2013  . Migraine 12/11/2012  . Tobacco abuse counseling 12/11/2012  . Hx of Graves' disease 12/11/2012  . Hypothyroidism following radioiodine therapy 05/19/2011    Past Surgical History:  Procedure Laterality Date  . ABDOMINAL HYSTERECTOMY  2013   and BSO  . ANKLE SURGERY  08/06/2007   left x2  . BILATERAL SALPINGOOPHORECTOMY  2013  . BREAST SURGERY Left 2013   biopsy-benign  . CHOLECYSTECTOMY  1990  . ganglion cyst removal  08/06/2003   left wrist  . TUBAL LIGATION  2000    Prior to Admission medications   Medication Sig Start Date End Date Taking? Authorizing Provider  acetaminophen (TYLENOL) 325 MG tablet Take 650 mg by mouth as needed.   Yes [provider]  albuterol (PROVENTIL HFA;VENTOLIN HFA) 108 (90 BASE) MCG/ACT inhaler Inhale 1-2 puffs into the lungs every 6 (six) hours as needed for wheezing or shortness of breath. 04/05/15  Yes Jackolyn Confer, MD  Cholecalciferol (VITAMIN D-3 PO) Take 5,000 Units by mouth daily.    Yes [provider]  DULoxetine (CYMBALTA) 60 MG capsule Take 60 mg by mouth daily.    Yes [provider]  fluticasone-salmeterol (  ADVAIR HFA) 115-21 MCG/ACT inhaler Inhale 2 puffs into the lungs 2 (two) times daily. 06/28/15  Yes Fisher, Linden Dolin, PA-C  folic acid (FOLVITE) 1 MG tablet Take 2 mg by mouth daily.   Yes [provider]  gabapentin (NEURONTIN) 300 MG capsule Take 300 mg by mouth 2 (two) times daily.   Yes [provider]  hydroxychloroquine (PLAQUENIL) 200 MG tablet Take 400 mg by mouth daily.   Yes [provider]  ibuprofen (ADVIL,MOTRIN) 800 MG tablet Take 800 mg by mouth  every 6 (six) hours as needed.    Yes [provider]  LamoTRIgine XR 200 MG TB24 Take 200 mg by mouth daily.  01/25/16  Yes [provider]  levothyroxine (SYNTHROID, LEVOTHROID) 200 MCG tablet Take 200 mcg by mouth daily.   Yes [provider]  Magnesium 100 MG CAPS Take 2 capsules by mouth 2 (two) times daily.   Yes [provider]  methotrexate (RHEUMATREX) 2.5 MG tablet Take 25 mg by mouth once a week.    Yes [provider]  pantoprazole (PROTONIX) 40 MG tablet Take 1 tablet (40 mg total) by mouth 2 (two) times daily. Patient taking differently: Take 40 mg by mouth daily.  05/14/16  Yes Arnett, Yvetta Coder, FNP  predniSONE (DELTASONE) 5 MG tablet Take 5 mg by mouth daily.   Yes [provider]  SUMAtriptan (IMITREX) 100 MG tablet Take 50 mg by mouth every 2 (two) hours as needed for migraine or headache. May repeat in 2 hours if headache persists or recurs.   Yes [provider]  topiramate (TOPAMAX) 100 MG tablet Take 1 tablet (100 mg total) by mouth 2 (two) times daily. 03/21/15  Yes Jackolyn Confer, MD  chlorpheniramine-HYDROcodone (TUSSIONEX PENNKINETIC ER) 10-8 MG/5ML SUER Take 5 mLs by mouth 2 (two) times daily. 01/18/17   Loney Hering, MD  levofloxacin (LEVAQUIN) 750 MG tablet Take 1 tablet (750 mg total) by mouth daily. 01/18/17   Loney Hering, MD    Allergies Inh [isoniazid] and Zithromax [azithromycin]  Family History  Problem Relation Age of Onset  . Cancer Mother 37       fallopian tube  . Arthritis Mother   . Heart disease Mother   . Diabetes Mother   . Hypertension Mother   . Coronary artery disease Father   . Cancer Maternal Aunt        Breast CA  . Diabetes Maternal Grandmother   . Diabetes Maternal Grandfather   . Diabetes Paternal Grandmother   . Diabetes Paternal Grandfather   . Breast cancer Paternal Aunt   . Breast cancer Paternal Aunt   . Breast cancer Paternal Aunt     Social  History Social History  Substance Use Topics  . Smoking status: Current Some Day Smoker    Packs/day: 2.00    Years: 20.00    Types: Cigarettes    Last attempt to quit: 08/05/2013  . Smokeless tobacco: Never Used  . Alcohol use No    Review of Systems  Constitutional:  fever/chills Eyes: No visual changes. ENT: No sore throat. Cardiovascular: Denies chest pain. Respiratory: Cough, wheezing and shortness of breath. Gastrointestinal: No abdominal pain.  No nausea, no vomiting.  No diarrhea.  No constipation. Genitourinary: Negative for dysuria. Musculoskeletal: Body aches Skin: Negative for rash. Neurological: Negative for headaches, focal weakness or numbness.   ____________________________________________   PHYSICAL EXAM:  VITAL SIGNS: ED Triage Vitals  Enc Vitals Group  BP 01/17/17 2044 (!) 123/53     Pulse Rate 01/17/17 2044 (!) 103     Resp 01/17/17 2044 (!) 22     Temp 01/17/17 2044 100.2 F (37.9 C)     Temp Source 01/17/17 2044 Oral     SpO2 01/17/17 2044 96 %     Weight 01/17/17 2045 242 lb (109.8 kg)     Height 01/17/17 2045 5\' 11"  (1.803 m)     Head Circumference --      Peak Flow --      Pain Score 01/17/17 2044 4     Pain Loc --      Pain Edu? --      Excl. in [place]? --     Constitutional: Alert and oriented. Well appearing and in Mild distress. Eyes: Conjunctivae are normal. PERRL. EOMI. Head: Atraumatic. Nose: No congestion/rhinnorhea. Mouth/Throat: Mucous membranes are moist.  Oropharynx non-erythematous. Cardiovascular: Normal rate, regular rhythm. Grossly normal heart sounds.  Good peripheral circulation. Respiratory: Normal respiratory effort.  No retractions. Mild expiratory wheezing. Gastrointestinal: Soft and nontender. No distention. Positive bowel sounds Musculoskeletal: No lower extremity tenderness nor edema.   Neurologic:  Normal speech and language.  Skin:  Skin is warm, dry and intact. Marland Kitchen Psychiatric: Mood and affect are normal.    ____________________________________________   LABS (all labs ordered are listed, but only abnormal results are displayed)  Labs Reviewed  CBC WITH DIFFERENTIAL/PLATELET - Abnormal; Notable for the following:       Result Value   WBC 11.7 (*)    Neutro Abs 9.3 (*)    All other components within normal limits  COMPREHENSIVE METABOLIC PANEL - Abnormal; Notable for the following:    Glucose, Bld 101 (*)    Calcium 8.8 (*)    ALT 12 (*)    All other components within normal limits  CULTURE, BLOOD (ROUTINE X 2)  CULTURE, BLOOD (ROUTINE X 2)  TROPONIN I  LACTIC ACID, PLASMA   ____________________________________________  EKG  ED ECG REPORT I, Loney Hering, the attending physician, personally viewed and interpreted this ECG.   Date: 01/17/2017  EKG Time: 2051  Rate: 99  Rhythm: normal sinus rhythm  Axis: normal  Intervals:none  ST&T Change: non3  ____________________________________________  RADIOLOGY  Dg Chest 2 View  Result Date: 01/17/2017 CLINICAL DATA:  Shortness of breath fever and cough EXAM: CHEST  2 VIEW COMPARISON:  04/04/2015 FINDINGS: Diffuse interstitial coarse pulmonary opacity, likely chronic. No focal consolidation or effusion. Normal cardiomediastinal silhouette. No pneumothorax. Apical emphysema. IMPRESSION: 1. No focal infiltrate is seen 2. Diffuse coarse interstitial lung opacity, suspect that this is chronic. Electronically Signed   By: Donavan Foil M.D.   On: 01/17/2017 21:34    ____________________________________________   PROCEDURES  Procedure(s) performed: None  Procedures  Critical Care performed: No  ____________________________________________   INITIAL IMPRESSION / ASSESSMENT AND PLAN / ED COURSE  Pertinent labs & imaging results that were available during my care of the patient were reviewed by me and considered in my medical decision making (see chart for details).  This is a 46 year old female who comes into the  hospital today with a fever, wheezing, shortness of breath. The patient reports that she was tachycardic at her doctor's office. She does have some wheezing and I'm concerned for possible COPD exacerbation. I will give the patient two DuoNeb treatments as well as some levofloxacin. The patient's chest x-ray does not show specific pneumonia but I feel that the patient may  be having a bit of a COPD exacerbation with an upper respiratory infection. I will reassess the patient once she's received her medications. Patient is also receiving some fluids. She was initially called as a code sepsis although she is not tachycardic nor febrile at this time.     The patient was feeling improved after the medications. Since the patient is immunosuppressed I will send her home with some antibiotics. The patient did receive some Tussionex for cough and some fluids. I did give the patient some levofloxacin. The patient will be discharged home to follow back up with her primary care physician. She also received Afrin for nasal congestion. ____________________________________________   FINAL CLINICAL IMPRESSION(S) / ED DIAGNOSES  Final diagnoses:  COPD exacerbation ([place])  Upper respiratory tract infection, unspecified type  Cough      NEW MEDICATIONS STARTED DURING THIS VISIT:  Discharge Medication List as of 01/18/2017  2:36 AM    START taking these medications   Details  chlorpheniramine-HYDROcodone (TUSSIONEX PENNKINETIC ER) 10-8 MG/5ML SUER Take 5 mLs by mouth 2 (two) times daily., Starting Sat 01/18/2017, Print    levofloxacin (LEVAQUIN) 750 MG tablet Take 1 tablet (750 mg total) by mouth daily., Starting Sat 01/18/2017, Print         Note:  This document was prepared using Dragon voice recognition software and may include unintentional dictation errors.    Loney Hering, MD 01/18/17 [phone]

## 2017-01-17 NOTE — ED Triage Notes (Signed)
Patient c/o SOB, fever, and cough. Patient TMax 103 at home. Pt has been taking ibuprofen every 4 hours, last dose at 1900 today.

## 2017-01-17 NOTE — ED Notes (Signed)
Pt assisted up to commode for urination. 

## 2017-01-17 NOTE — ED Notes (Addendum)
Dr. Alfred Levins notified of possible sepsis pt. Pt with RA, takes methotrexate and has been febrile with cough. Lactic acid and one set of blood cultures obtained. md notified.

## 2017-01-18 MED ORDER — HYDROCOD POLST-CPM POLST ER 10-8 MG/5ML PO SUER
5.0000 mL | Freq: Once | ORAL | Status: AC
  Administered 2017-01-18: 5 mL via ORAL
  Filled 2017-01-18: qty 5

## 2017-01-18 MED ORDER — OXYMETAZOLINE HCL 0.05 % NA SOLN
1.0000 | Freq: Once | NASAL | Status: AC
  Administered 2017-01-18: 1 via NASAL
  Filled 2017-01-18: qty 15

## 2017-01-18 MED ORDER — ACETAMINOPHEN 500 MG PO TABS
1000.0000 mg | ORAL_TABLET | Freq: Once | ORAL | Status: AC
  Administered 2017-01-18: 1000 mg via ORAL
  Filled 2017-01-18: qty 2

## 2017-01-18 MED ORDER — LEVOFLOXACIN 750 MG PO TABS: 750.0000 mg | ORAL_TABLET | Freq: Every day | ORAL | 0 refills | Status: DC

## 2017-01-18 MED ORDER — HYDROCOD POLST-CPM POLST ER 10-8 MG/5ML PO SUER: 5.0000 mL | Freq: Two times a day (BID) | ORAL | 0 refills | Status: DC

## 2017-01-18 NOTE — Discharge Instructions (Signed)
Please follow back up with her primary care physician.

## 2017-01-22 LAB — CULTURE, BLOOD (ROUTINE X 2)
CULTURE: NO GROWTH
Culture: NO GROWTH
SPECIAL REQUESTS: ADEQUATE
SPECIAL REQUESTS: ADEQUATE

## 2017-07-07 ENCOUNTER — Other Ambulatory Visit: Payer: Self-pay | Admitting: Obstetrics and Gynecology

## 2017-07-07 DIAGNOSIS — Z1231 Encounter for screening mammogram for malignant neoplasm of breast: Secondary | ICD-10-CM

## 2017-07-31 ENCOUNTER — Ambulatory Visit
Admission: RE | Admit: 2017-07-31 | Discharge: 2017-07-31 | Disposition: A | Discharge status: Home / Self Care | Payer: BC Managed Care – PPO | Source: Ambulatory Visit | Attending: Obstetrics and Gynecology | Admitting: Obstetrics and Gynecology

## 2017-07-31 DIAGNOSIS — Z1231 Encounter for screening mammogram for malignant neoplasm of breast: Secondary | ICD-10-CM

## 2017-09-26 ENCOUNTER — Encounter: Payer: 59 | Admitting: Obstetrics and Gynecology

## 2017-10-15 ENCOUNTER — Encounter: Payer: 59 | Admitting: Obstetrics and Gynecology

## 2017-10-17 ENCOUNTER — Encounter: Payer: Self-pay | Admitting: Obstetrics and Gynecology

## 2017-10-17 ENCOUNTER — Ambulatory Visit (INDEPENDENT_AMBULATORY_CARE_PROVIDER_SITE_OTHER): Payer: BC Managed Care – PPO | Admitting: Obstetrics and Gynecology

## 2017-10-17 VITALS — BP 125/90 | HR 96 | Ht 71.0 in | Wt 258.2 lb

## 2017-10-17 DIAGNOSIS — Z01419 Encounter for gynecological examination (general) (routine) without abnormal findings: Secondary | ICD-10-CM

## 2017-10-17 NOTE — Progress Notes (Signed)
Subjective:   [name] is a 47 y.o. No obstetric history on file. Caucasian female here for a routine well-woman exam.  No LMP recorded. Patient has had a hysterectomy.    Current complaints: weight gain since starting [place] PCP: [facility]       does desire labs  Social History: Sexual: heterosexual Marital Status: married Living situation: with spouse Occupation: intake Optician, dispensing cancer [employer] Tobacco/alcohol: no tobacco use Illicit drugs: no history of illicit drug use  The following portions of the patient's history were reviewed and updated as appropriate: allergies, current medications, past family history, past medical history, past social history, past surgical history and problem list.  Past Medical History Past Medical History:  Diagnosis Date  . Allergy   . COPD (chronic obstructive pulmonary disease) ([place])   . Depression   . GERD (gastroesophageal reflux disease)   . Hypertension   . Lupus   . Migraines   . Positive TB test   . Sjoegren syndrome ([place])   . Sleep apnea   . Thyroid disease    Graves disease radioactive iodine 10/12    Past Surgical History Past Surgical History:  Procedure Laterality Date  . ABDOMINAL HYSTERECTOMY  2013   and BSO  . ANKLE SURGERY  08/06/2007   left x2  . BILATERAL SALPINGOOPHORECTOMY  2013  . BREAST CYST ASPIRATION Left    neg  . BREAST SURGERY Left 2013   biopsy-benign  . CHOLECYSTECTOMY  1990  . ganglion cyst removal  08/06/2003   left wrist  . TUBAL LIGATION  2000    Gynecologic History No obstetric history on file.  No LMP recorded. Patient has had a hysterectomy. Contraception: status post hysterectomy Last Pap: 2016. Results were: normal Last mammogram: 07/2017. Results were: normal   Obstetric History OB History  No data available    Current Medications Current Outpatient Medications on File Prior to Visit  Medication Sig Dispense Refill  . acetaminophen (TYLENOL) 325 MG tablet Take 650  mg by mouth as needed.    . Adalimumab (HUMIRA) 10 MG/0.1ML PSKT Inject into the skin.    . Cholecalciferol (VITAMIN D-3 PO) Take 5,000 Units by mouth daily.     . DULoxetine (CYMBALTA) 60 MG capsule Take 60 mg by mouth daily.     . fluticasone-salmeterol (ADVAIR HFA) 115-21 MCG/ACT inhaler Inhale 2 puffs into the lungs 2 (two) times daily. 1 Inhaler 12  . folic acid (FOLVITE) 1 MG tablet Take 2 mg by mouth daily.    Marland Kitchen gabapentin (NEURONTIN) 300 MG capsule Take 300 mg by mouth 2 (two) times daily.    . hydroxychloroquine (PLAQUENIL) 200 MG tablet Take 400 mg by mouth daily.    Marland Kitchen ibuprofen (ADVIL,MOTRIN) 800 MG tablet Take 800 mg by mouth every 6 (six) hours as needed.     . LamoTRIgine XR 200 MG TB24 Take 200 mg by mouth daily.   0  . levothyroxine (SYNTHROID, LEVOTHROID) 200 MCG tablet Take 200 mcg by mouth daily.    . Magnesium 100 MG CAPS Take 2 capsules by mouth 2 (two) times daily.    . methotrexate (RHEUMATREX) 2.5 MG tablet Take 25 mg by mouth once a week.   5  . pantoprazole (PROTONIX) 40 MG tablet Take 1 tablet (40 mg total) by mouth 2 (two) times daily. (Patient taking differently: Take 40 mg by mouth daily. ) 180 tablet 1  . predniSONE (DELTASONE) 5 MG tablet Take 5 mg by mouth daily.    Marland Kitchen  SUMAtriptan (IMITREX) 100 MG tablet Take 50 mg by mouth every 2 (two) hours as needed for migraine or headache. May repeat in 2 hours if headache persists or recurs.    . topiramate (TOPAMAX) 100 MG tablet Take 1 tablet (100 mg total) by mouth 2 (two) times daily. 180 tablet 0  . albuterol (PROVENTIL HFA;VENTOLIN HFA) 108 (90 BASE) MCG/ACT inhaler Inhale 1-2 puffs into the lungs every 6 (six) hours as needed for wheezing or shortness of breath. (Patient not taking: Reported on 10/17/2017) 1 Inhaler 4  . chlorpheniramine-HYDROcodone (TUSSIONEX PENNKINETIC ER) 10-8 MG/5ML SUER Take 5 mLs by mouth 2 (two) times daily. (Patient not taking: Reported on 10/17/2017) 140 mL 0  . levofloxacin (LEVAQUIN) 750 MG  tablet Take 1 tablet (750 mg total) by mouth daily. (Patient not taking: Reported on 10/17/2017) 7 tablet 0   No current facility-administered medications on file prior to visit.     Review of Systems Patient denies any headaches, blurred vision, shortness of breath, chest pain, abdominal pain, problems with bowel movements, urination, or intercourse.  Objective:  BP 125/90   Pulse 96   Ht 5\' 11"  (1.803 m)   Wt 258 lb 3.2 oz (117.1 kg)   BMI 36.01 kg/m  Physical Exam  General:  Well developed, well nourished, no acute distress. She is alert and oriented x3. Skin:  Warm and dry Neck:  Midline trachea, no thyromegaly or nodules Cardiovascular: Regular rate and rhythm, no murmur heard Lungs:  Effort normal, all lung fields clear to auscultation bilaterally Breasts:  No dominant palpable mass, retraction, or nipple discharge Abdomen:  Soft, non tender, no hepatosplenomegaly or masses Pelvic:  External genitalia is normal in appearance.  The vagina is normal in appearance. The cervix is bulbous, no CMT.  Thin prep pap is not done . Uterus is surgically absent. No adnexal masses or tenderness noted. Extremities:  No swelling or varicosities noted Psych:  She has a normal mood and affect  Assessment:   Healthy well-woman exam  Plan:   F/U 1 Year for AE, or sooner if needed   Melody Rockney Ghee, CNM

## 2018-01-13 IMAGING — CT CT ABD-PELV W/ CM
2 of 5 series · 15 of 46 positions shown, 17 images · IV contrast (iopamidol)
Comparison: CT abdomen dated 10/02/2006.

CLINICAL DATA: Right upper quadrant pain and left lower quadrant
pain since yesterday. History of cholecystectomy and hysterectomy.

EXAM:
CT ABDOMEN AND PELVIS WITH CONTRAST
TECHNIQUE: Multidetector CT imaging of the abdomen and pelvis was performed
using the standard protocol following bolus administration of
intravenous contrast.
CONTRAST:  100mL 57ERCE-M88 IOPAMIDOL ([id number]-M88) INJECTION 61%

[Series 2: routine abd pel with · axial · 0.89mm/px · z∈[-610,-160]mm · 12 of 102 slices shown, 14 images]
[im 6/102  soft-tissue]
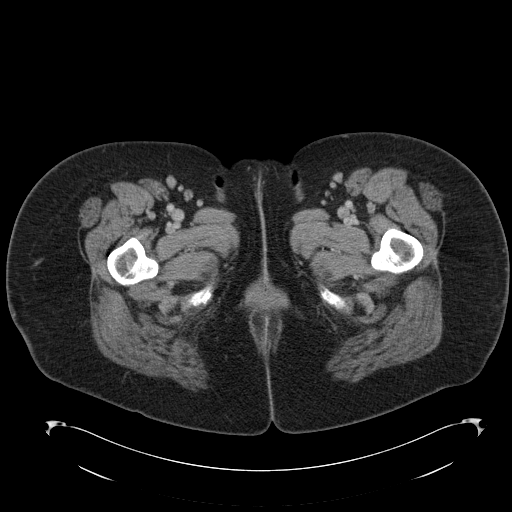
[im 6/102  bone]
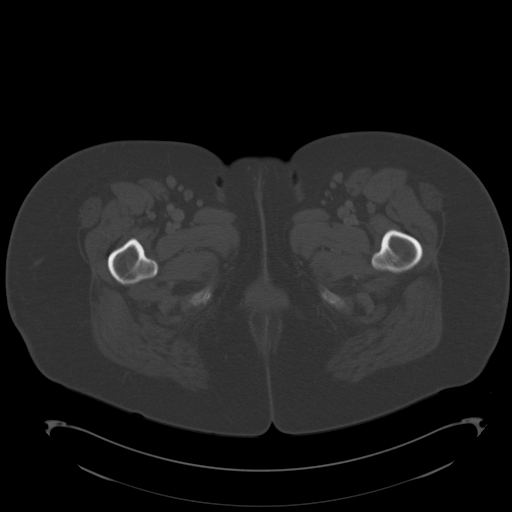
[im 16/102  soft-tissue]
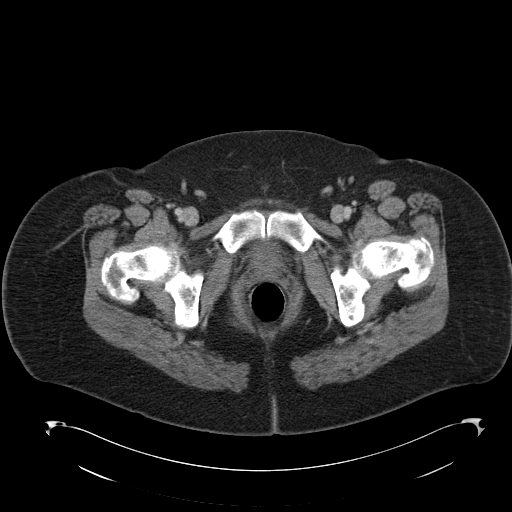
[im 22/102  soft-tissue]
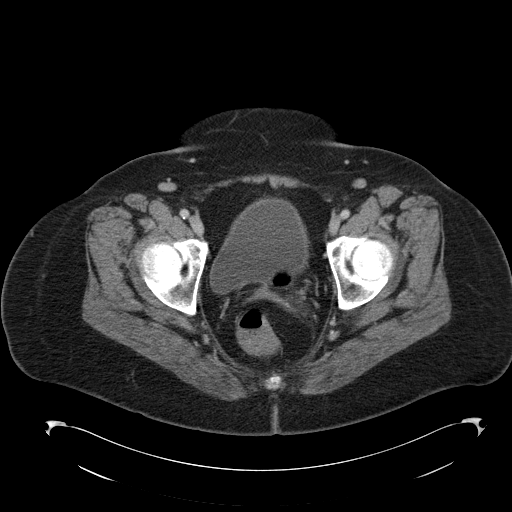
[im 32/102  soft-tissue]
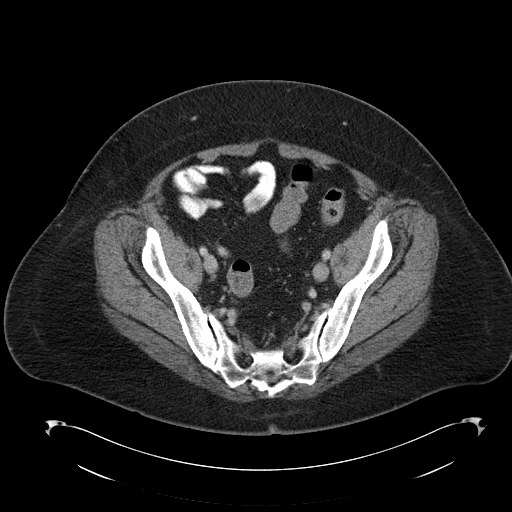
[im 38/102  soft-tissue]
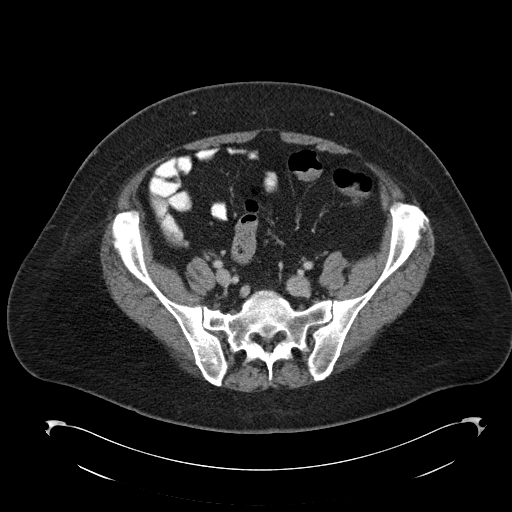
[im 48/102  soft-tissue]
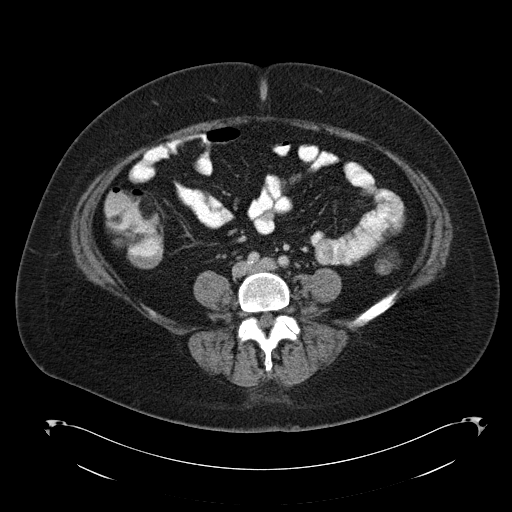
[im 54/102  soft-tissue]
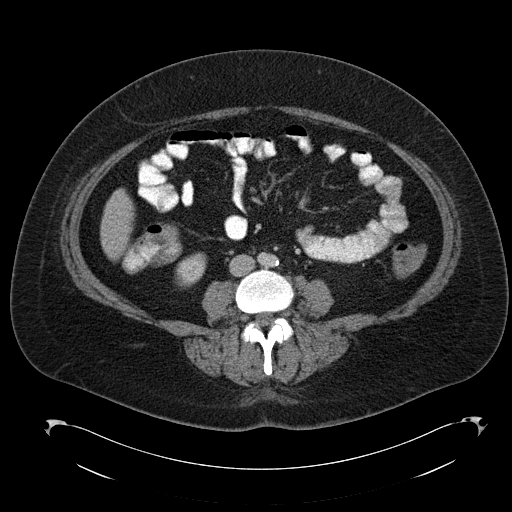
[im 64/102  soft-tissue]
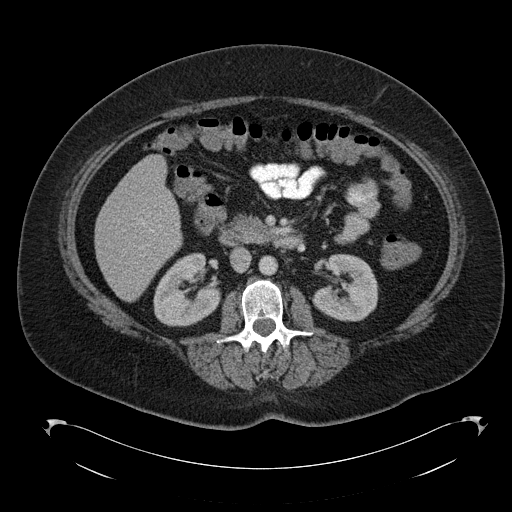
[im 70/102  soft-tissue]
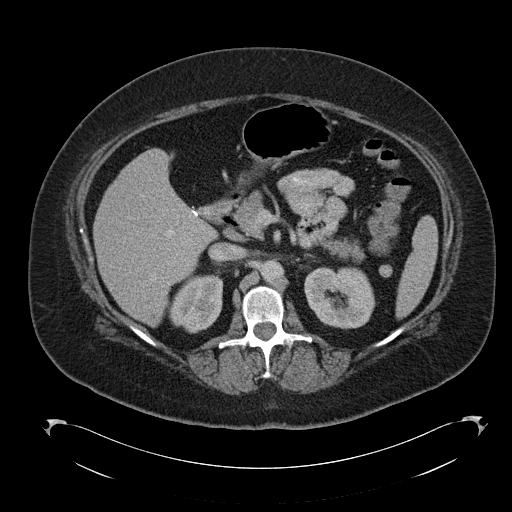
[im 70/102  bone]
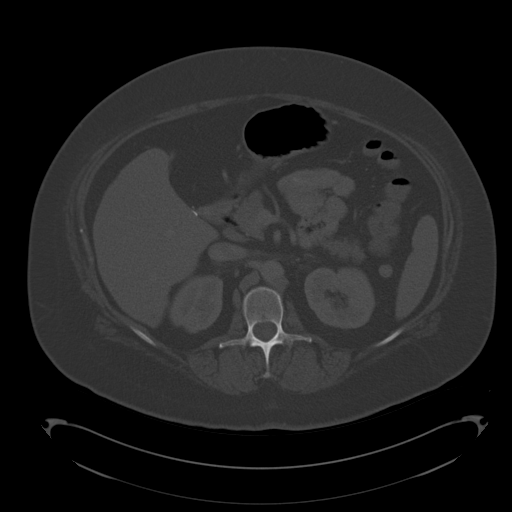
[im 80/102  soft-tissue]
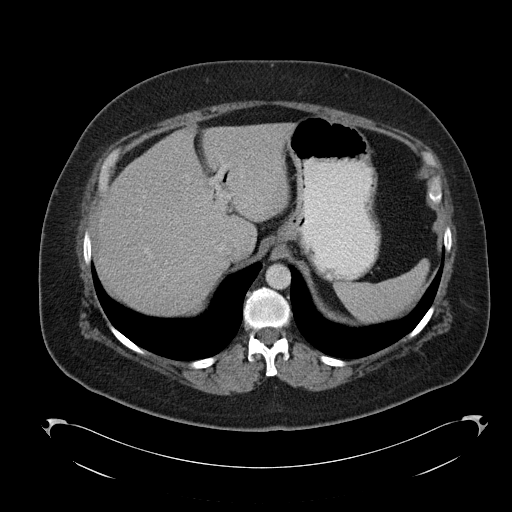
[im 86/102  soft-tissue]
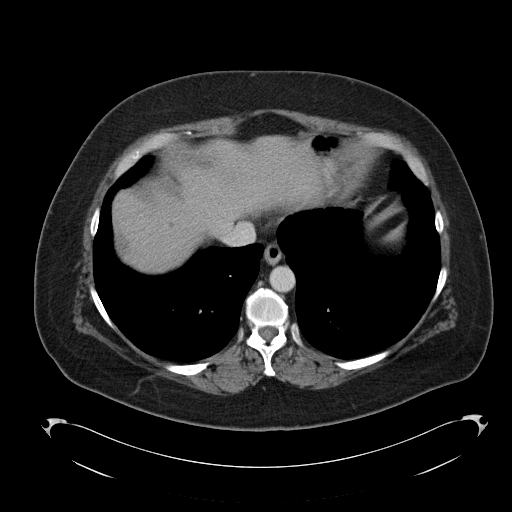
[im 96/102  soft-tissue]
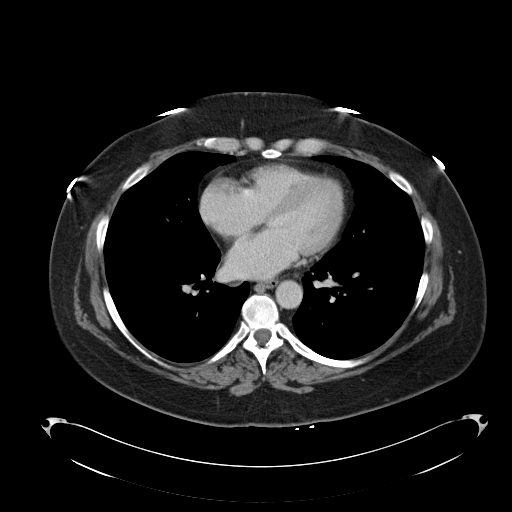

[Series 6: cor routine abd pel with · coronal · 0.74mm/px · 3 of 148 slices shown]
[im 50/148  soft-tissue]
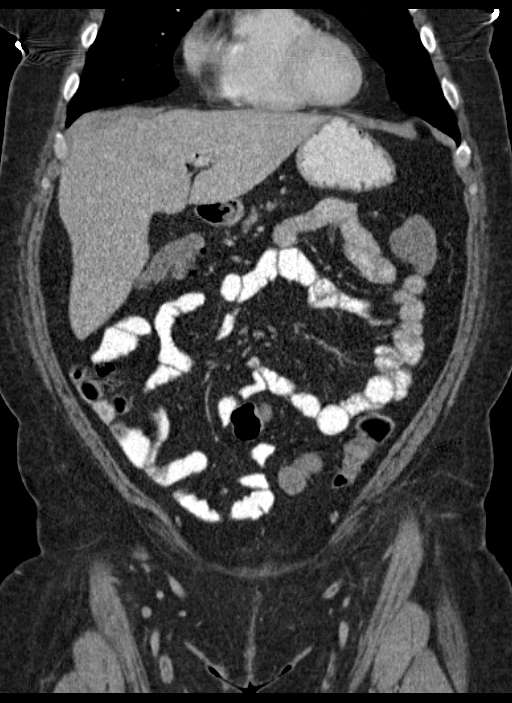
[im 66/148  soft-tissue]
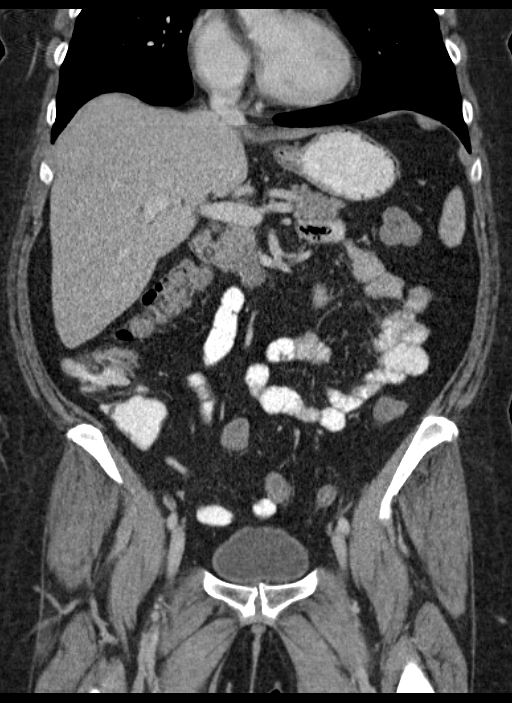
[im 82/148  soft-tissue]
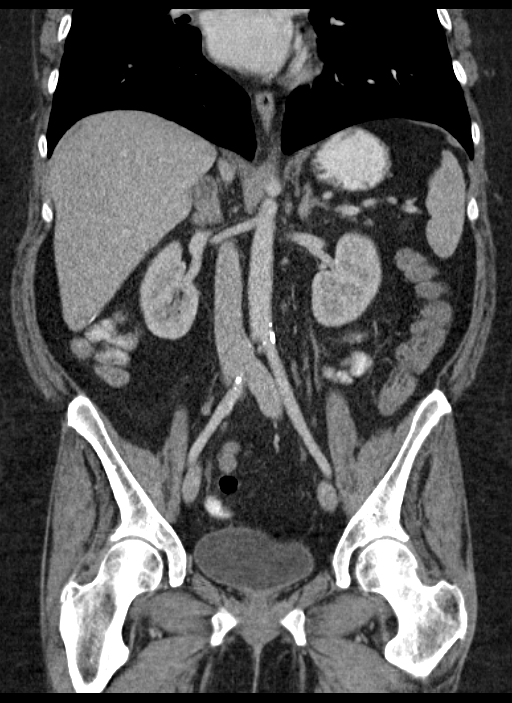

[15 of 46 positions shown; findings below may reference images not displayed]

FINDINGS: Lower chest:  No acute findings.

Hepatobiliary: Status post cholecystectomy. Associated pneumobilia.
Liver appears normal.

Pancreas: No mass, inflammatory changes, or other significant
abnormality.

Spleen: Within normal limits in size and appearance.

Adrenals/Urinary Tract: New 1.8 cm mass within the right adrenal
gland, with CT density measurements that are compatible with benign
lipid-rich adrenal adenoma. Left adrenal gland appears normal.

Kidneys appear normal without stone or hydronephrosis. No ureteral
or bladder calculi identified.

Stomach/Bowel: Bowel is normal in caliber. No bowel wall thickening
or evidence of bowel wall inflammation seen. Appendix is normal.
Stomach appears normal.

Vascular/Lymphatic: No pathologically enlarged lymph nodes. No
evidence of abdominal aortic aneurysm.

Reproductive: Status post hysterectomy.

Other: No free fluid or abscess collections seen. No free
intraperitoneal air.

Musculoskeletal: Mild degenerative change in the lumbar spine. No
acute or suspicious osseous finding. Superficial soft tissues are
unremarkable.
IMPRESSION: 1. No acute findings within the abdomen or pelvis. No source for
right upper quadrant pain or left lower quadrant pain identified. No
renal or ureteral calculi. Appendix is normal. No bowel obstruction
or evidence of bowel wall inflammation. No free fluid.
2. Benign right adrenal adenoma.

## 2018-06-24 ENCOUNTER — Other Ambulatory Visit: Payer: Self-pay | Admitting: Obstetrics and Gynecology

## 2018-06-24 DIAGNOSIS — Z1231 Encounter for screening mammogram for malignant neoplasm of breast: Secondary | ICD-10-CM

## 2018-08-03 ENCOUNTER — Ambulatory Visit
Admission: RE | Admit: 2018-08-03 | Discharge: 2018-08-03 | Disposition: A | Discharge status: Home / Self Care | Payer: BC Managed Care – PPO | Source: Ambulatory Visit | Attending: Obstetrics and Gynecology | Admitting: Obstetrics and Gynecology

## 2018-08-03 DIAGNOSIS — Z1231 Encounter for screening mammogram for malignant neoplasm of breast: Secondary | ICD-10-CM | POA: Insufficient documentation

## 2018-10-22 ENCOUNTER — Encounter: Payer: BC Managed Care – PPO | Admitting: Obstetrics and Gynecology

## 2018-10-27 ENCOUNTER — Encounter: Payer: BC Managed Care – PPO | Admitting: Obstetrics and Gynecology

## 2019-02-22 ENCOUNTER — Other Ambulatory Visit: Payer: Self-pay | Admitting: Surgery

## 2019-02-26 ENCOUNTER — Ambulatory Visit
Admission: RE | Admit: 2019-02-26 | Discharge: 2019-02-26 | Disposition: A | Discharge status: Home / Self Care | Payer: BC Managed Care – PPO | Source: Ambulatory Visit | Attending: Surgery | Admitting: Surgery

## 2019-02-26 ENCOUNTER — Other Ambulatory Visit: Payer: Self-pay

## 2019-03-18 ENCOUNTER — Other Ambulatory Visit: Payer: Self-pay

## 2019-03-18 ENCOUNTER — Encounter: Payer: Self-pay | Admitting: Dietician

## 2019-03-18 ENCOUNTER — Encounter: Payer: BC Managed Care – PPO | Attending: Student | Admitting: Dietician

## 2019-03-18 VITALS — Wt 264.6 lb

## 2019-03-18 DIAGNOSIS — Z713 Dietary counseling and surveillance: Secondary | ICD-10-CM | POA: Diagnosis not present

## 2019-03-18 NOTE — Patient Instructions (Signed)
Patient to use food guide plate as a guide for better balance at meals. To establish a meal pattern of 3 meals spaced 4-5 hours apart. To try protein supplements. To practice drinking fluids between meals. To continue to walk for exercise and increase from 3 days weekly. To read the information instructed on today and call if has further questions.

## 2019-03-18 NOTE — Progress Notes (Signed)
Nutrition and Diabetes Education [facility] [place], [place] [address]  Date: 03/18/19 Re: [name] DOB: Apr 20, 1971 MRN: [id number]   MD: Dr. Johnathan Hausen RD:   Karolee Stamps, RD, LDN        Diagnosis: obesity Nutrition Assessment: Patient came for pre-bariatric nutrition evaluation visit. She states that she plans to have gastric sleeve bariatric surgery.   Height:   71 in Weight:   264.6 lbs BMI: 36.9 IBW/ %IBW: 170%  Weight Loss Goal:  165 lbs  Medical History: OSA, RA, Graves Disease, elevated cholesterol, GERD, hx of depression Medications/Supplements: Terbinafine HCL, Keryclin, Xeljanz XR, Levothyroxine Sodium, Pantoprazole Sodium, Eszopicione, Topiramate, Prednisone, Albuterol Sulfate, Hydroxychloroquine Sulfate, Methotrexate, folic acid, lamotrigine, magnesium, duloxetine, tylenol, Vitamin D,  Previous Surgeries: Gallbladder surgery, Hysterectomy  Drug Allergies: Azithromycin Food Allergies/Intolerance: seafood, broccoli, cauliflower, beets  Alcohol Intake: 0-1 serving monthly Tobacco Use: former smoker Physical Activity: walks for exercise 30-40 minutes, 3 x per week  Weight History: Reports a relatively stable weight of 165 lbs from high school until 2012. She reports in a 2 year period, both her parents, 3 uncles and 4 aunts died. She states she has continued to gain 10-20 lbs /year since then. She reports a gain of approximately 20 lbs in the past year.  Diet/Weight Loss History: She reports she has tried numerous diets with limited and temporary success. She was taking phentermine and meeting with an RD for 8 months in 2019 and lost 28 lbs but regained.  Dietary Recall: Food and Fluid: B/ 0:86- 1 slice toast with butter, 1.5 cups coffee with half/half 9:00:- ham or turkey/cheese sandwich or poptart or cereal/milk 12-1:00pm- package of snack crackers/peanut butter 4-5:00pm- Ex. Chicken, 1/4-1/2 cup  stuffing or potatoes or rice ;sometimes eats a vegetable with this meal but not always. Snack: Has been making an effort to not snack after dinner but sometimes eats ice cream.            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 She works for [employer] + fellows and residents and describes her job as extremely stressful. She states she doesn't "stress"eat but does eat more due to feeling sad with the number of losses she has experienced in recent years. She meets regularly with a counselor. No history of purging with laxatives or vomiting.  Education: Patient was instructed on and is aware of the following guideline/recommendations:  Pre and Post operative dietary guidelines  The need for daily multi-vitamin, calcium  following surgery.  Post surgery protein recommendations including suggestions for protein shakes.  Post surgery fluid recommendations post surgery.   Alcohol intake and cigarette use are strongly discouraged after surgery.  That carbonated beverages must be eliminated 2 weeks prior to surgery and after surgery.  That planning pre op and post op meals is important. She was instructed on the 2 week pre-op diet.  Exercise is a needed adjunct for weight loss.  Summary: From a nutrition standpoint, I feel that  [name] is a good candidate to continue the bariatric surgery process. She has participated in an Neurosurgeon and has talked with friends who have had bariatric surgery. She has discussed the surgery with her primary care physician, rheumatologist, counselor and psychiatrist. She states her husband is supportive  and states, "He just wants me to do what will make me happy." She states, "I am about to turn 48 and I want to make  my life better. My parents had diabetes and heart disease and I want to be here for my family."        The patient is requested to participate in pre and post-operative follow-up appointments (2-weeks pre-op, 2 weeks post-op, 8-weeks post-op, 3 months post-op, 23-months post op, and 1-year post-op). This is to promote his weight loss goals.  If you have any questions or need any further information regarding your patient, please feel free to contact me at [phone].  Sincerely,   Karolee Stamps, RD, LDN Registered Dietitian Nutrition and [place]: [phone] F: [email or web address]

## 2019-04-08 ENCOUNTER — Ambulatory Visit (INDEPENDENT_AMBULATORY_CARE_PROVIDER_SITE_OTHER): Payer: BC Managed Care – PPO | Admitting: Psychology

## 2019-04-08 DIAGNOSIS — F509 Eating disorder, unspecified: Secondary | ICD-10-CM

## 2019-04-19 ENCOUNTER — Ambulatory Visit (INDEPENDENT_AMBULATORY_CARE_PROVIDER_SITE_OTHER): Payer: BC Managed Care – PPO | Admitting: Psychology

## 2019-04-21 ENCOUNTER — Ambulatory Visit: Payer: BC Managed Care – PPO | Admitting: Psychology

## 2019-05-05 ENCOUNTER — Ambulatory Visit: Payer: BC Managed Care – PPO | Admitting: Psychology

## 2019-05-14 ENCOUNTER — Encounter: Payer: BC Managed Care – PPO | Admitting: Obstetrics and Gynecology

## 2019-05-21 ENCOUNTER — Other Ambulatory Visit: Payer: Self-pay

## 2019-05-21 ENCOUNTER — Encounter: Payer: BC Managed Care – PPO | Attending: Student | Admitting: Dietician

## 2019-05-21 VITALS — Ht 70.0 in | Wt 258.1 lb

## 2019-05-21 DIAGNOSIS — Z6837 Body mass index (BMI) 37.0-37.9, adult: Secondary | ICD-10-CM

## 2019-05-21 DIAGNOSIS — E669 Obesity, unspecified: Secondary | ICD-10-CM | POA: Diagnosis not present

## 2019-05-21 NOTE — Progress Notes (Signed)
Pre-Operative Nutrition Class:  Appt start time: 0900   End time:  1100.  Patient was seen on 05/21/19 for Pre-Operative Bariatric Surgery Education at Nutrition and Diabetes Education Services at [facility].   Surgery date: TBD Surgery type: Sleeve Gastrectomy Start weight at [facility]: 264.6lbs Weight today: 258.1lbs  InBody  BODY COMP RESULTS    BMI (kg/m^2) 37.0  Fat Mass (lbs) 134.2  Dry Lean Mass (lbs) 33.5  Total Body Water (lbs) 90.4   Samples given per MNT protocol. Patient educated on appropriate usage: Celebrate Vitamins Multivitamin  Lot # [id number],  Exp: 11/2020; Lot# [id number],  Exp: 10/2020  Celebrate Vitamins Calcium Citrate   Lot # [id number] Exp 04/2020, Lot# [id number], Exp: 01/2020; Lot# [id number], Exp: 02/2020; Lot# [id number], Exp: 03/2020; Lot#0006, Exp:02/2020; QNV#[id number], Exp: 07/2019; Lot# 0002, Exp: 02/2020; Lot# [id number], Exp: 06/2020  Celebrate Vitamins Iron 63m  soft chew  Lot# [id number] Exp: 01/2021; Iron 666msoft chew [id number], Exp: 01/2021 Celebrate Vitamins B12 Quick Melt  Lot# [id number], Exp: 08/2019  UnRenee Painrotein Powder   Lot # [id number]: 01/2020; Lot# [id number]: 01/2020; Lot# [id number]: 01/2020  Premier Protein Shake   Lot# [id number]: 07/26/19   The following the learning objectives were met by the patient during this course:  Identify Pre-Op Dietary Goals and will begin 2 weeks pre-operatively  Identify appropriate sources of fluids and proteins   State protein recommendations and appropriate sources pre and post-operatively  Identify Post-Operative Dietary Goals and will follow for 2 weeks post-operatively  Identify appropriate multivitamin and calcium sources  Describe the need for physical activity post-operatively and will follow MD recommendations  State when to call healthcare provider regarding medication questions or post-operative complications  Handouts given during class include:  Pre-Op Bariatric Surgery Diet Handout  Protein Shake  Handout  Post-Op Bariatric Surgery Nutrition Handout  BELT Program Information Flyer  Support Group Information Flyer  [facility] Bariatric Supplements Price List  Follow-Up Plan: Patient will follow-up at [facility] about 2 weeks post operatively for diet advancement per MD.

## 2019-06-16 ENCOUNTER — Ambulatory Visit: Payer: Self-pay | Admitting: Surgery

## 2019-06-16 NOTE — H&P (View-Only) (Signed)
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  Informed consent has been obtained in the office.    Past Medical History:  Diagnosis Date  . Allergy   . COPD (chronic obstructive pulmonary disease) ([place])   . Depression   . GERD (gastroesophageal reflux disease)   . Hypertension   . Lupus ([place])   . Migraines   . Positive TB test   . Sjoegren syndrome   . Sleep apnea   . Thyroid disease    Graves disease radioactive iodine 10/12    Past Surgical History:  Procedure Laterality Date  . ABDOMINAL HYSTERECTOMY  2013   and BSO  . ANKLE SURGERY  08/06/2007   left x2  . BILATERAL SALPINGOOPHORECTOMY  2013  . BREAST CYST ASPIRATION Left    neg  . BREAST SURGERY Left 2013   biopsy-benign  . CHOLECYSTECTOMY  1990  . ganglion cyst removal  08/06/2003   left wrist  . OOPHORECTOMY    . TUBAL LIGATION  2000    Current Outpatient Medications  Medication Sig Dispense Refill  . acetaminophen (TYLENOL) 325 MG tablet Take 650 mg by mouth as needed.    . Adalimumab (HUMIRA) 10 MG/0.1ML PSKT Inject into the skin.    Marland Kitchen albuterol (PROVENTIL HFA;VENTOLIN HFA) 108 (90 BASE) MCG/ACT inhaler Inhale 1-2 puffs into the lungs every 6 (six) hours as needed for wheezing or shortness of breath. 1 Inhaler 4  . chlorpheniramine-HYDROcodone (TUSSIONEX PENNKINETIC ER) 10-8 MG/5ML SUER Take 5 mLs by mouth 2 (two) times daily. (Patient not taking: Reported on 10/17/2017) 140 mL 0  . Cholecalciferol (VITAMIN D-3 PO) Take 5,000 Units by mouth daily.     . DULoxetine (CYMBALTA) 60 MG capsule Take 60 mg by mouth daily.     . fluticasone-salmeterol (ADVAIR HFA) 115-21 MCG/ACT inhaler Inhale 2 puffs into the lungs 2 (two) times daily. (Patient not taking: Reported on 03/18/2019) 1 Inhaler 12  . folic acid (FOLVITE) 1 MG tablet Take 2 mg by mouth daily.    Marland Kitchen gabapentin (NEURONTIN) 300 MG  capsule Take 300 mg by mouth 2 (two) times daily.    . hydroxychloroquine (PLAQUENIL) 200 MG tablet Take 400 mg by mouth daily.    Marland Kitchen ibuprofen (ADVIL,MOTRIN) 800 MG tablet Take 800 mg by mouth every 6 (six) hours as needed.     . LamoTRIgine XR 200 MG TB24 Take 200 mg by mouth daily.   0  . levofloxacin (LEVAQUIN) 750 MG tablet Take 1 tablet (750 mg total) by mouth daily. (Patient not taking: Reported on 10/17/2017) 7 tablet 0  . levothyroxine (SYNTHROID, LEVOTHROID) 200 MCG tablet Take 200 mcg by mouth daily.    . Magnesium 100 MG CAPS Take 2 capsules by mouth 2 (two) times daily.    . methotrexate (RHEUMATREX) 2.5 MG tablet Take 25 mg by mouth once a week.   5  . pantoprazole (PROTONIX) 40 MG tablet Take 1 tablet (40 mg total) by mouth 2 (two) times daily. (Patient taking differently: Take 40 mg by mouth daily. ) 180 tablet 1  . predniSONE (DELTASONE) 5 MG tablet Take 5 mg by mouth daily.    . SUMAtriptan (IMITREX) 100 MG tablet Take 50 mg by mouth every 2 (two) hours as needed for migraine or headache. May repeat in 2 hours if headache persists or recurs.    Marland Kitchen  Tavaborole (KERYDIN) 5 % SOLN Apply topically 2 (two) times daily as needed.    . terbinafine (LAMISIL) 250 MG tablet Take 250 mg by mouth 2 (two) times daily as needed.    . Tofacitinib Citrate ER (XELJANZ XR) 11 MG TB24 Take by mouth.    . topiramate (TOPAMAX) 100 MG tablet Take 1 tablet (100 mg total) by mouth 2 (two) times daily. 180 tablet 0   No current facility-administered medications for this visit.    Inh [isoniazid] and Zithromax [azithromycin] Family History  Problem Relation Age of Onset  . Cancer Mother 67       fallopian tube  . Arthritis Mother   . Heart disease Mother   . Diabetes Mother   . Hypertension Mother   . Coronary artery disease Father   . Cancer Maternal Aunt        Breast CA  . Diabetes Maternal Grandmother   . Diabetes Maternal Grandfather   . Diabetes Paternal Grandmother   . Diabetes Paternal  Grandfather   . Breast cancer Paternal Aunt   . Breast cancer Paternal Aunt   . Breast cancer Paternal Aunt    Social History:   reports that she quit smoking about 5 years ago. Her smoking use included cigarettes. She has a 40.00 pack-year smoking history. She has never used smokeless tobacco. She reports that she does not drink alcohol or use drugs.   REVIEW OF SYSTEMS : Negative except for see problem list  Physical Exam:   There were no vitals taken for this visit. There is no height or weight on file to calculate BMI.  Gen:  WDWN WF NAD  Neurological: Alert and oriented to person, place, and time. Motor and sensory function is grossly intact  Head: Normocephalic and atraumatic.  Eyes: Conjunctivae are normal. Pupils are equal, round, and reactive to light. No scleral icterus. Proptosis Neck: Normal range of motion. Neck supple. No tracheal deviation or thyromegaly present.  Cardiovascular:  SR without murmurs or gallops.  No carotid bruits Breast:  Not examined Respiratory: Effort normal.  No respiratory distress. No chest wall tenderness. Breath sounds normal.  No wheezes, rales or rhonchi.  Abdomen:  nontender GU:  Not examined Musculoskeletal: Normal range of motion. Extremities are nontender. No cyanosis, edema or clubbing noted Lymphadenopathy: No cervical, preauricular, postauricular or axillary adenopathy is present Skin: Skin is warm and dry. No rash noted. No diaphoresis. No erythema. No pallor. Pscyh: Normal mood and affect. Behavior is normal. Judgment and thought content normal.   LABORATORY RESULTS: No results found for this or any previous visit (from the past 48 hour(s)).   RADIOLOGY RESULTS: No results found.  Problem List: Patient Active Problem List   Diagnosis Date Noted  . Candidiasis of skin 11/29/2015  . Skin lesion 11/29/2015  . Rheumatoid arthritis ([place]) 08/11/2015  . COPD (chronic obstructive pulmonary disease) ([place]) 04/28/2015  . B12  deficiency 04/28/2015  . Obesity (BMI 30-39.9) 03/21/2015  . Chronic fatigue 10/17/2014  . Arthralgia 09/05/2014  . Major depressive disorder, recurrent episode ([place]) 09/05/2014  . Vitamin D deficiency 06/02/2014  . Hyperlipidemia 06/02/2014  . BPPV (benign paroxysmal positional vertigo) 08/25/2013  . GERD (gastroesophageal reflux disease) 08/12/2013  . Migraine 12/11/2012  . Tobacco abuse counseling 12/11/2012  . Hx of Graves' disease 12/11/2012  . Hypothyroidism following radioiodine therapy 05/19/2011    Assessment & Plan: GER -- will check for and repair if present [facility] and perform sleeve gastrectomy    Matt B. Hassell Done,  MD, [facility] [phone] beeper [phone]  06/16/2019 1:44 PM

## 2019-06-16 NOTE — H&P (Signed)
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  Informed consent has been obtained in the office.    Past Medical History:  Diagnosis Date  . Allergy   . COPD (chronic obstructive pulmonary disease) ([place])   . Depression   . GERD (gastroesophageal reflux disease)   . Hypertension   . Lupus ([place])   . Migraines   . Positive TB test   . Sjoegren syndrome   . Sleep apnea   . Thyroid disease    Graves disease radioactive iodine 10/12    Past Surgical History:  Procedure Laterality Date  . ABDOMINAL HYSTERECTOMY  2013   and BSO  . ANKLE SURGERY  08/06/2007   left x2  . BILATERAL SALPINGOOPHORECTOMY  2013  . BREAST CYST ASPIRATION Left    neg  . BREAST SURGERY Left 2013   biopsy-benign  . CHOLECYSTECTOMY  1990  . ganglion cyst removal  08/06/2003   left wrist  . OOPHORECTOMY    . TUBAL LIGATION  2000    Current Outpatient Medications  Medication Sig Dispense Refill  . acetaminophen (TYLENOL) 325 MG tablet Take 650 mg by mouth as needed.    . Adalimumab (HUMIRA) 10 MG/0.1ML PSKT Inject into the skin.    Marland Kitchen albuterol (PROVENTIL HFA;VENTOLIN HFA) 108 (90 BASE) MCG/ACT inhaler Inhale 1-2 puffs into the lungs every 6 (six) hours as needed for wheezing or shortness of breath. 1 Inhaler 4  . chlorpheniramine-HYDROcodone (TUSSIONEX PENNKINETIC ER) 10-8 MG/5ML SUER Take 5 mLs by mouth 2 (two) times daily. (Patient not taking: Reported on 10/17/2017) 140 mL 0  . Cholecalciferol (VITAMIN D-3 PO) Take 5,000 Units by mouth daily.     . DULoxetine (CYMBALTA) 60 MG capsule Take 60 mg by mouth daily.     . fluticasone-salmeterol (ADVAIR HFA) 115-21 MCG/ACT inhaler Inhale 2 puffs into the lungs 2 (two) times daily. (Patient not taking: Reported on 03/18/2019) 1 Inhaler 12  . folic acid (FOLVITE) 1 MG tablet Take 2 mg by mouth daily.    Marland Kitchen gabapentin (NEURONTIN) 300 MG  capsule Take 300 mg by mouth 2 (two) times daily.    . hydroxychloroquine (PLAQUENIL) 200 MG tablet Take 400 mg by mouth daily.    Marland Kitchen ibuprofen (ADVIL,MOTRIN) 800 MG tablet Take 800 mg by mouth every 6 (six) hours as needed.     . LamoTRIgine XR 200 MG TB24 Take 200 mg by mouth daily.   0  . levofloxacin (LEVAQUIN) 750 MG tablet Take 1 tablet (750 mg total) by mouth daily. (Patient not taking: Reported on 10/17/2017) 7 tablet 0  . levothyroxine (SYNTHROID, LEVOTHROID) 200 MCG tablet Take 200 mcg by mouth daily.    . Magnesium 100 MG CAPS Take 2 capsules by mouth 2 (two) times daily.    . methotrexate (RHEUMATREX) 2.5 MG tablet Take 25 mg by mouth once a week.   5  . pantoprazole (PROTONIX) 40 MG tablet Take 1 tablet (40 mg total) by mouth 2 (two) times daily. (Patient taking differently: Take 40 mg by mouth daily. ) 180 tablet 1  . predniSONE (DELTASONE) 5 MG tablet Take 5 mg by mouth daily.    . SUMAtriptan (IMITREX) 100 MG tablet Take 50 mg by mouth every 2 (two) hours as needed for migraine or headache. May repeat in 2 hours if headache persists or recurs.    Marland Kitchen  Tavaborole (KERYDIN) 5 % SOLN Apply topically 2 (two) times daily as needed.    . terbinafine (LAMISIL) 250 MG tablet Take 250 mg by mouth 2 (two) times daily as needed.    . Tofacitinib Citrate ER (XELJANZ XR) 11 MG TB24 Take by mouth.    . topiramate (TOPAMAX) 100 MG tablet Take 1 tablet (100 mg total) by mouth 2 (two) times daily. 180 tablet 0   No current facility-administered medications for this visit.    Inh [isoniazid] and Zithromax [azithromycin] Family History  Problem Relation Age of Onset  . Cancer Mother 9       fallopian tube  . Arthritis Mother   . Heart disease Mother   . Diabetes Mother   . Hypertension Mother   . Coronary artery disease Father   . Cancer Maternal Aunt        Breast CA  . Diabetes Maternal Grandmother   . Diabetes Maternal Grandfather   . Diabetes Paternal Grandmother   . Diabetes Paternal  Grandfather   . Breast cancer Paternal Aunt   . Breast cancer Paternal Aunt   . Breast cancer Paternal Aunt    Social History:   reports that she quit smoking about 5 years ago. Her smoking use included cigarettes. She has a 40.00 pack-year smoking history. She has never used smokeless tobacco. She reports that she does not drink alcohol or use drugs.   REVIEW OF SYSTEMS : Negative except for see problem list  Physical Exam:   There were no vitals taken for this visit. There is no height or weight on file to calculate BMI.  Gen:  WDWN WF NAD  Neurological: Alert and oriented to person, place, and time. Motor and sensory function is grossly intact  Head: Normocephalic and atraumatic.  Eyes: Conjunctivae are normal. Pupils are equal, round, and reactive to light. No scleral icterus. Proptosis Neck: Normal range of motion. Neck supple. No tracheal deviation or thyromegaly present.  Cardiovascular:  SR without murmurs or gallops.  No carotid bruits Breast:  Not examined Respiratory: Effort normal.  No respiratory distress. No chest wall tenderness. Breath sounds normal.  No wheezes, rales or rhonchi.  Abdomen:  nontender GU:  Not examined Musculoskeletal: Normal range of motion. Extremities are nontender. No cyanosis, edema or clubbing noted Lymphadenopathy: No cervical, preauricular, postauricular or axillary adenopathy is present Skin: Skin is warm and dry. No rash noted. No diaphoresis. No erythema. No pallor. Pscyh: Normal mood and affect. Behavior is normal. Judgment and thought content normal.   LABORATORY RESULTS: No results found for this or any previous visit (from the past 48 hour(s)).   RADIOLOGY RESULTS: No results found.  Problem List: Patient Active Problem List   Diagnosis Date Noted  . Candidiasis of skin 11/29/2015  . Skin lesion 11/29/2015  . Rheumatoid arthritis ([place]) 08/11/2015  . COPD (chronic obstructive pulmonary disease) ([place]) 04/28/2015  . B12  deficiency 04/28/2015  . Obesity (BMI 30-39.9) 03/21/2015  . Chronic fatigue 10/17/2014  . Arthralgia 09/05/2014  . Major depressive disorder, recurrent episode ([place]) 09/05/2014  . Vitamin D deficiency 06/02/2014  . Hyperlipidemia 06/02/2014  . BPPV (benign paroxysmal positional vertigo) 08/25/2013  . GERD (gastroesophageal reflux disease) 08/12/2013  . Migraine 12/11/2012  . Tobacco abuse counseling 12/11/2012  . Hx of Graves' disease 12/11/2012  . Hypothyroidism following radioiodine therapy 05/19/2011    Assessment & Plan: GER -- will check for and repair if present [facility] and perform sleeve gastrectomy    Matt B. Hassell Done,  MD, [facility] [phone] beeper [phone]  06/16/2019 1:44 PM

## 2019-06-22 ENCOUNTER — Encounter: Payer: BC Managed Care – PPO | Admitting: Obstetrics and Gynecology

## 2019-06-25 NOTE — Patient Instructions (Addendum)
DUE TO COVID-19 ONLY ONE VISITOR IS ALLOWED TO COME WITH YOU AND STAY IN THE WAITING ROOM ONLY DURING PRE OP AND PROCEDURE DAY OF SURGERY. THE 1 VISITOR MAY VISIT WITH YOU AFTER SURGERY IN YOUR PRIVATE ROOM DURING VISITING HOURS ONLY!  YOU NEED TO HAVE A COVID 19 TEST ON 11-27-2020_ @__0840  am_____, THIS TEST MUST BE DONE BEFORE SURGERY, COME  [place] , [address].  ([place]) ONCE YOUR COVID TEST IS COMPLETED, PLEASE BEGIN THE QUARANTINE INSTRUCTIONS AS OUTLINED IN YOUR HANDOUT.                [name]    Your procedure is scheduled on: 07-05-2019   Report to [facility] Main  Entrance    Report to admitting at  1145 AM     Call this number if you have problems the morning of surgery [phone]    Remember: [place].   MORNING OF SURGERY DRINK:   DRINK 1 G2 drink BEFORE YOU LEAVE HOME, DRINK ALL OF THE  G2 DRINK AT ONE TIME.    NO SOLID FOOD AFTER 600 PM THE NIGHT BEFORE YOUR SURGERY.   YOU MAY DRINK CLEAR FLUIDS. THE G2 DRINK YOU DRINK BEFORE YOU LEAVE HOME WILL BE THE LAST FLUIDS YOU DRINK BEFORE SURGERY. DRINK G2 AT 1045 AM.  PAIN IS EXPECTED AFTER SURGERY AND WILL NOT BE COMPLETELY ELIMINATED. AMBULATION AND TYLENOL WILL HELP REDUCE INCISIONAL AND GAS PAIN. MOVEMENT IS KEY!  YOU ARE EXPECTED TO BE OUT OF BED WITHIN 4 HOURS OF ADMISSION TO YOUR PATIENT ROOM.  SITTING IN THE RECLINER THROUGHOUT THE DAY IS IMPORTANT FOR DRINKING FLUIDS AND MOVING GAS THROUGHOUT THE GI TRACT.  COMPRESSION STOCKINGS SHOULD BE WORN [place] UNLESS YOU ARE WALKING.   INCENTIVE SPIROMETER SHOULD BE USED EVERY HOUR WHILE AWAKE TO DECREASE POST-OPERATIVE COMPLICATIONS SUCH AS PNEUMONIA.  WHEN DISCHARGED HOME, IT IS IMPORTANT TO CONTINUE TO WALK EVERY HOUR AND USE THE INCENTIVE SPIROMETER EVERY HOUR.           CLEAR LIQUID DIET   Foods Allowed                                                                      Foods Excluded  Coffee and tea, regular and decaf                             liquids that you cannot  Plain Jell-O any favor except red or purple                                           see through such as: Fruit ices (not with fruit pulp)                                     milk, soups, orange juice  Iced Popsicles  All solid food Carbonated beverages, regular and diet                                    Cranberry, grape and apple juices Sports drinks like Gatorade Lightly seasoned clear broth or consume(fat free) Sugar, honey syrup  Sample Menu Breakfast                                Lunch                                     Supper Cranberry juice                    Beef broth                            Chicken broth Jell-O                                     Grape juice                           Apple juice Coffee or tea                        Jell-O                                      Popsicle                                                Coffee or tea                        Coffee or tea  _____________________________________________________________________    Take these medicines the morning of surgery with A SIP OF WATER: HYDROCODONE IF NEEDED, ALBUTEROL INHALER IF NEEDED AND BRING INHALER, GABAPENTIN (NEURONTIN), LAMOTRIGENE, TOPIRAMAMTE (TOPAMAX), DULOXETINE (CYMBALTA), LEVOTHYROXINE (SYNTHROID), PANTAPRAZOLE (PROTONIX)                                 You may not have any metal on your body including hair pins and              piercings  Do not wear jewelry, make-up, lotions, powders or perfumes, deodorant             Do not wear nail polish on your fingernails.  Do not shave  48 hours prior to surgery.          Do not bring valuables to the hospital. [place].  Contacts, dentures or bridgework may not be worn into surgery.  Leave  suitcase in the car. After surgery it may be brought to your room.  Please read over the following fact sheets you were given: _____________________________________________________________________             [facility] - Preparing for Surgery Before surgery, you can play an important role.  Because skin is not sterile, your skin needs to be as free of germs as possible.  You can reduce the number of germs on your skin by washing with CHG (chlorahexidine gluconate) soap before surgery.  CHG is an antiseptic cleaner which kills germs and bonds with the skin to continue killing germs even after washing. Please DO NOT use if you have an allergy to CHG or antibacterial soaps.  If your skin becomes reddened/irritated stop using the CHG and inform your nurse when you arrive at Short Stay. Do not shave (including legs and underarms) for at least 48 hours prior to the first CHG shower.  You may shave your face/neck. Please follow these instructions carefully:  1.  Shower with CHG Soap the night before surgery and the  morning of Surgery.  2.  If you choose to wash your hair, wash your hair first as usual with your  normal  shampoo.  3.  After you shampoo, rinse your hair and body thoroughly to remove the  shampoo.                           4.  Use CHG as you would any other liquid soap.  You can apply chg directly  to the skin and wash                       Gently with a scrungie or clean washcloth.  5.  Apply the CHG Soap to your body ONLY FROM THE NECK DOWN.   Do not use on face/ open                           Wound or open sores. Avoid contact with eyes, ears mouth and genitals (private parts).                       Wash face,  Genitals (private parts) with your normal soap.             6.  Wash thoroughly, paying special attention to the area where your surgery  will be performed.  7.  Thoroughly rinse your body with warm water from the neck down.  8.  DO NOT shower/wash with your  normal soap after using and rinsing off  the CHG Soap.                9.  Pat yourself dry with a clean towel.            10.  Wear clean pajamas.            11.  Place clean sheets on your bed the night of your first shower and do not  sleep with pets. Day of Surgery : Do not apply any lotions/deodorants the morning of surgery.  Please wear clean clothes to the hospital/surgery center.  FAILURE TO FOLLOW THESE INSTRUCTIONS MAY RESULT IN THE CANCELLATION OF YOUR SURGERY PATIENT SIGNATURE_________________________________  NURSE SIGNATURE__________________________________  ________________________________________________________________________   Adam Phenix  An incentive spirometer is a tool that can help keep your lungs clear and active. This tool measures how well you are filling your lungs with each breath. Taking long  deep breaths may help reverse or decrease the chance of developing breathing (pulmonary) problems (especially infection) following:  A long period of time when you are unable to move or be active. BEFORE THE PROCEDURE   If the spirometer includes an indicator to show your best effort, your nurse or respiratory therapist will set it to a desired goal.  If possible, sit up straight or lean slightly forward. Try not to slouch.  Hold the incentive spirometer in an upright position. INSTRUCTIONS FOR USE  1. Sit on the edge of your bed if possible, or sit up as far as you can in bed or on a chair. 2. Hold the incentive spirometer in an upright position. 3. Breathe out normally. 4. Place the mouthpiece in your mouth and seal your lips tightly around it. 5. Breathe in slowly and as deeply as possible, raising the piston or the ball toward the top of the column. 6. Hold your breath for 3-5 seconds or for as long as possible. Allow the piston or ball to fall to the bottom of the column. 7. Remove the mouthpiece from your mouth and breathe out normally. 8. Rest for a  few seconds and repeat Steps 1 through 7 at least 10 times every 1-2 hours when you are awake. Take your time and take a few normal breaths between deep breaths. 9. The spirometer may include an indicator to show your best effort. Use the indicator as a goal to work toward during each repetition. 10. After each set of 10 deep breaths, practice coughing to be sure your lungs are clear. If you have an incision (the cut made at the time of surgery), support your incision when coughing by placing a pillow or rolled up towels firmly against it. Once you are able to get out of bed, walk around indoors and cough well. You may stop using the incentive spirometer when instructed by your caregiver.  RISKS AND COMPLICATIONS  Take your time so you do not get dizzy or light-headed.  If you are in pain, you may need to take or ask for pain medication before doing incentive spirometry. It is harder to take a deep breath if you are having pain. AFTER USE  Rest and breathe slowly and easily.  It can be helpful to keep track of a log of your progress. Your caregiver can provide you with a simple table to help with this. If you are using the spirometer at home, follow these instructions: [place] IF:   You are having difficultly using the spirometer.  You have trouble using the spirometer as often as instructed.  Your pain medication is not giving enough relief while using the spirometer.  You develop fever of 100.5 F (38.1 C) or higher. SEEK IMMEDIATE MEDICAL CARE IF:   You cough up bloody sputum that had not been present before.  You develop fever of 102 F (38.9 C) or greater.  You develop worsening pain at or near the incision site. MAKE SURE YOU:   Understand these instructions.  Will watch your condition.  Will get help right away if you are not doing well or get worse. Document Released: 12/02/2006 Document Revised: 10/14/2011 Document Reviewed: 02/02/2007 ExitCare Patient  Information 2014 ExitCare, [place].   ________________________________________________________________________  WHAT IS A BLOOD TRANSFUSION? Blood Transfusion Information  A transfusion is the replacement of blood or some of its parts. Blood is made up of multiple cells which provide different functions.  Red blood cells carry oxygen and  are used for blood loss replacement.  White blood cells fight against infection.  Platelets control bleeding.  Plasma helps clot blood.  Other blood products are available for specialized needs, such as hemophilia or other clotting disorders. BEFORE THE TRANSFUSION  Who gives blood for transfusions?   Healthy volunteers who are fully evaluated to make sure their blood is safe. This is blood bank blood. Transfusion therapy is the safest it has ever been in the practice of medicine. Before blood is taken from a donor, a complete history is taken to make sure that person has no history of diseases nor engages in risky social behavior (examples are intravenous drug use or sexual activity with multiple partners). The donor's travel history is screened to minimize risk of transmitting infections, such as malaria. The donated blood is tested for signs of infectious diseases, such as HIV and hepatitis. The blood is then tested to be sure it is compatible with you in order to minimize the chance of a transfusion reaction. If you or a relative donates blood, this is often done in anticipation of surgery and is not appropriate for emergency situations. It takes many days to process the donated blood. RISKS AND COMPLICATIONS Although transfusion therapy is very safe and saves many lives, the main dangers of transfusion include:   Getting an infectious disease.  Developing a transfusion reaction. This is an allergic reaction to something in the blood you were given. Every precaution is taken to prevent this. The decision to have a blood transfusion has been considered  carefully by your caregiver before blood is given. Blood is not given unless the benefits outweigh the risks. AFTER THE TRANSFUSION  Right after receiving a blood transfusion, you will usually feel much better and more energetic. This is especially true if your red blood cells have gotten low (anemic). The transfusion raises the level of the red blood cells which carry oxygen, and this usually causes an energy increase.  The nurse administering the transfusion will monitor you carefully for complications. HOME CARE INSTRUCTIONS  No special instructions are needed after a transfusion. You may find your energy is better. Speak with your caregiver about any limitations on activity for underlying diseases you may have. SEEK MEDICAL CARE IF:   Your condition is not improving after your transfusion.  You develop redness or irritation at the intravenous (IV) site. SEEK IMMEDIATE MEDICAL CARE IF:  Any of the following symptoms occur over the next 12 hours:  Shaking chills.  You have a temperature by mouth above 102 F (38.9 C), not controlled by medicine.  Chest, back, or muscle pain.  People around you feel you are not acting correctly or are confused.  Shortness of breath or difficulty breathing.  Dizziness and fainting.  You get a rash or develop hives.  You have a decrease in urine output.  Your urine turns a dark color or changes to pink, red, or brown. Any of the following symptoms occur over the next 10 days:  You have a temperature by mouth above 102 F (38.9 C), not controlled by medicine.  Shortness of breath.  Weakness after normal activity.  The white part of the eye turns yellow (jaundice).  You have a decrease in the amount of urine or are urinating less often.  Your urine turns a dark color or changes to pink, red, or brown. Document Released: 07/19/2000 Document Revised: 10/14/2011 Document Reviewed: 03/07/2008 [facility] Patient Information 2014 [place],  [place].  _______________________________________________________________________

## 2019-06-28 ENCOUNTER — Encounter (HOSPITAL_COMMUNITY): Payer: Self-pay

## 2019-06-28 ENCOUNTER — Other Ambulatory Visit: Payer: Self-pay

## 2019-06-28 ENCOUNTER — Encounter (HOSPITAL_COMMUNITY)
Admission: RE | Admit: 2019-06-28 | Discharge: 2019-06-28 | Disposition: A | Discharge status: Home / Self Care | Payer: BC Managed Care – PPO | Source: Ambulatory Visit | Attending: Surgery | Admitting: Surgery

## 2019-06-28 DIAGNOSIS — Z01812 Encounter for preprocedural laboratory examination: Secondary | ICD-10-CM | POA: Diagnosis not present

## 2019-06-28 DIAGNOSIS — Z6837 Body mass index (BMI) 37.0-37.9, adult: Secondary | ICD-10-CM | POA: Insufficient documentation

## 2019-06-28 DIAGNOSIS — E669 Obesity, unspecified: Secondary | ICD-10-CM | POA: Diagnosis not present

## 2019-06-28 HISTORY — DX: Hypothyroidism, unspecified: E03.9

## 2019-06-28 HISTORY — DX: Unspecified osteoarthritis, unspecified site: M19.90

## 2019-06-28 LAB — COMPREHENSIVE METABOLIC PANEL
ALT: 24 U/L (ref 0–44)
AST: 24 U/L (ref 15–41)
Albumin: 4.4 g/dL (ref 3.5–5.0)
Alkaline Phosphatase: 69 U/L (ref 38–126)
Anion gap: 8 (ref 5–15)
BUN: 17 mg/dL (ref 6–20)
CO2: 23 mmol/L (ref 22–32)
Calcium: 9.4 mg/dL (ref 8.9–10.3)
Chloride: 107 mmol/L (ref 98–111)
Creatinine, Ser: 0.65 mg/dL (ref 0.44–1.00)
GFR calc Af Amer: >60 mL/min (ref >60)
GFR calc non Af Amer: >60 mL/min (ref >60)
Glucose, Bld: 93 mg/dL (ref 70–99)
Potassium: 4.3 mmol/L (ref 3.5–5.1)
Sodium: 138 mmol/L (ref 135–145)
Total Bilirubin: 0.4 mg/dL (ref 0.3–1.2)
Total Protein: 7.8 g/dL (ref 6.5–8.1)

## 2019-06-28 LAB — CBC WITH DIFFERENTIAL/PLATELET
Abs Immature Granulocytes: 0.03 10*3/uL (ref 0.00–0.07)
Basophils Absolute: 0.1 10*3/uL (ref 0.0–0.1)
Basophils Relative: 1 %
Eosinophils Absolute: 0.1 10*3/uL (ref 0.0–0.5)
Eosinophils Relative: 1 %
HCT: 41.6 % (ref 36.0–46.0)
Hemoglobin: 13.3 g/dL (ref 12.0–15.0)
Immature Granulocytes: 0 %
Lymphocytes Relative: 34 %
Lymphs Abs: 3 10*3/uL (ref 0.7–4.0)
MCH: 29.8 pg (ref 26.0–34.0)
MCHC: 32 g/dL (ref 30.0–36.0)
MCV: 93.1 fL (ref 80.0–100.0)
Monocytes Absolute: 0.8 10*3/uL (ref 0.1–1.0)
Monocytes Relative: 9 %
Neutro Abs: 4.7 10*3/uL (ref 1.7–7.7)
Neutrophils Relative %: 55 %
Platelets: 345 10*3/uL (ref 150–400)
RBC: 4.47 MIL/uL (ref 3.87–5.11)
RDW: 13.3 % (ref 11.5–15.5)
WBC: 8.6 10*3/uL (ref 4.0–10.5)
nRBC: 0 % (ref 0.0–0.2)

## 2019-06-28 LAB — ABO/RH: ABO/RH(D): A POS

## 2019-06-28 NOTE — Progress Notes (Addendum)
PCP - Dr. Shelbie Hutching   [facility] [place], [place] Cardiologist - none Rheumatologist-Dr. Toohey [place] [id number] She prescribes the Plaquenil, Methotrexate, and Morrie Sheldon for rheumatoid arthritis. Patient was instructed to get in touch with Rheumatology to ask them when she wants you to stop those medications. Patient verbalized understanding.   Chest x-ray - 02/26/2019 EKG - 02/26/2019 Stress Test -none  ECHO - none Cardiac Cath - none  Sleep Study - 2015 CPAP - yes   Fasting Blood Sugar - n/a Checks Blood Sugar _0____ times a day  Blood Thinner Instructions:none Aspirin Instructions:none Last Dose:none  Anesthesia review:   Patient denies shortness of breath, fever, cough and chest pain at PAT appointment   Patient verbalized understanding of instructions that were given to them at the PAT appointment. Patient was also instructed that they will need to review over the PAT instructions again at home before surgery.

## 2019-07-02 ENCOUNTER — Other Ambulatory Visit (HOSPITAL_COMMUNITY)
Admission: RE | Admit: 2019-07-02 | Discharge: 2019-07-02 | Disposition: A | Discharge status: Home / Self Care | Payer: BC Managed Care – PPO | Source: Ambulatory Visit | Attending: Surgery | Admitting: Surgery

## 2019-07-02 DIAGNOSIS — Z20828 Contact with and (suspected) exposure to other viral communicable diseases: Secondary | ICD-10-CM | POA: Insufficient documentation

## 2019-07-02 DIAGNOSIS — Z01812 Encounter for preprocedural laboratory examination: Secondary | ICD-10-CM | POA: Insufficient documentation

## 2019-07-02 LAB — SARS CORONAVIRUS 2 (TAT 6-24 HRS): SARS Coronavirus 2: NEGATIVE

## 2019-07-02 NOTE — Anesthesia Preprocedure Evaluation (Addendum)
Anesthesia Evaluation  Patient identified by MRN, date of birth, ID band Patient awake    Reviewed: Allergy & Precautions, NPO status , Patient's Chart, lab work & pertinent test results  History of Anesthesia Complications Negative for: history of anesthetic complications  Airway Mallampati: II  TM Distance: >3 FB Neck ROM: Full    Dental  (+) Edentulous Upper   Pulmonary sleep apnea and Continuous Positive Airway Pressure Ventilation , COPD,  COPD inhaler, former smoker,    Pulmonary exam normal        Cardiovascular hypertension, Normal cardiovascular exam  EKG 02/26/19: NSR, prolonged QTc 472ms   Neuro/Psych  Headaches, Depression    GI/Hepatic Neg liver ROS, GERD  Medicated and Controlled,  Endo/Other  Hypothyroidism Morbid obesity  Renal/GU negative Renal ROS  negative genitourinary   Musculoskeletal  (+) Arthritis , Rheumatoid disorders,    Abdominal   Peds  Hematology negative hematology ROS (+)   Anesthesia Other Findings Day of surgery medications reviewed with patient.  Reproductive/Obstetrics negative OB ROS                            Anesthesia Physical Anesthesia Plan  ASA: III  Anesthesia Plan: General   Post-op Pain Management:    Induction: Intravenous  PONV Risk Score and Plan: 4 or greater and Treatment may vary due to age or medical condition, Ondansetron, Dexamethasone and Midazolam  Airway Management Planned: Oral ETT and Video Laryngoscope Planned  Additional Equipment: None  Intra-op Plan:   Post-operative Plan: Extubation in OR  Informed Consent: I have reviewed the patients History and Physical, chart, labs and discussed the procedure including the risks, benefits and alternatives for the proposed anesthesia with the patient or authorized representative who has indicated his/her understanding and acceptance.     Dental advisory given  Plan  Discussed with: CRNA  Anesthesia Plan Comments:        Anesthesia Quick Evaluation

## 2019-07-05 ENCOUNTER — Other Ambulatory Visit: Payer: Self-pay

## 2019-07-05 ENCOUNTER — Encounter (HOSPITAL_COMMUNITY): Payer: Self-pay | Admitting: *Deleted

## 2019-07-05 ENCOUNTER — Inpatient Hospital Stay (HOSPITAL_COMMUNITY): Payer: BC Managed Care – PPO | Admitting: Physician Assistant

## 2019-07-05 ENCOUNTER — Inpatient Hospital Stay (HOSPITAL_COMMUNITY)
Admission: RE | Admit: 2019-07-05 | Discharge: 2019-07-06 | DRG: 621 | Disposition: A | Discharge status: Home / Self Care | Payer: BC Managed Care – PPO | Attending: Surgery | Admitting: Surgery

## 2019-07-05 ENCOUNTER — Encounter (HOSPITAL_COMMUNITY)
Admission: RE | Disposition: A | Discharge status: Home / Self Care | Payer: Self-pay | Source: Home / Self Care | Attending: Surgery

## 2019-07-05 ENCOUNTER — Inpatient Hospital Stay (HOSPITAL_COMMUNITY): Payer: BC Managed Care – PPO | Admitting: Anesthesiology

## 2019-07-05 DIAGNOSIS — Z87891 Personal history of nicotine dependence: Secondary | ICD-10-CM

## 2019-07-05 DIAGNOSIS — Z9049 Acquired absence of other specified parts of digestive tract: Secondary | ICD-10-CM | POA: Diagnosis not present

## 2019-07-05 DIAGNOSIS — Z833 Family history of diabetes mellitus: Secondary | ICD-10-CM

## 2019-07-05 DIAGNOSIS — Z90710 Acquired absence of both cervix and uterus: Secondary | ICD-10-CM

## 2019-07-05 DIAGNOSIS — Z7952 Long term (current) use of systemic steroids: Secondary | ICD-10-CM | POA: Diagnosis not present

## 2019-07-05 DIAGNOSIS — Z79899 Other long term (current) drug therapy: Secondary | ICD-10-CM | POA: Diagnosis not present

## 2019-07-05 DIAGNOSIS — Z6838 Body mass index (BMI) 38.0-38.9, adult: Secondary | ICD-10-CM

## 2019-07-05 DIAGNOSIS — Z79890 Hormone replacement therapy (postmenopausal): Secondary | ICD-10-CM | POA: Diagnosis not present

## 2019-07-05 DIAGNOSIS — Z7951 Long term (current) use of inhaled steroids: Secondary | ICD-10-CM

## 2019-07-05 DIAGNOSIS — Z8249 Family history of ischemic heart disease and other diseases of the circulatory system: Secondary | ICD-10-CM | POA: Diagnosis not present

## 2019-07-05 DIAGNOSIS — Z803 Family history of malignant neoplasm of breast: Secondary | ICD-10-CM | POA: Diagnosis not present

## 2019-07-05 DIAGNOSIS — Z8261 Family history of arthritis: Secondary | ICD-10-CM | POA: Diagnosis not present

## 2019-07-05 DIAGNOSIS — Z9884 Bariatric surgery status: Secondary | ICD-10-CM

## 2019-07-05 HISTORY — PX: LAPAROSCOPIC GASTRIC SLEEVE RESECTION: SHX5895

## 2019-07-05 LAB — TYPE AND SCREEN
ABO/RH(D): A POS
Antibody Screen: NEGATIVE

## 2019-07-05 LAB — HEMOGLOBIN AND HEMATOCRIT, BLOOD
HCT: 42.6 % (ref 36.0–46.0)
Hemoglobin: 13.4 g/dL (ref 12.0–15.0)

## 2019-07-05 LAB — PREGNANCY, URINE: Preg Test, Ur: NEGATIVE

## 2019-07-05 LAB — CREATININE, SERUM
Creatinine, Ser: 0.78 mg/dL (ref 0.44–1.00)
GFR calc Af Amer: >60 mL/min (ref >60)
GFR calc non Af Amer: >60 mL/min (ref >60)

## 2019-07-05 LAB — CBC
HCT: 43.5 % (ref 36.0–46.0)
Hemoglobin: 13.5 g/dL (ref 12.0–15.0)
MCH: 29.9 pg (ref 26.0–34.0)
MCHC: 31 g/dL (ref 30.0–36.0)
MCV: 96.5 fL (ref 80.0–100.0)
Platelets: 318 10*3/uL (ref 150–400)
RBC: 4.51 MIL/uL (ref 3.87–5.11)
RDW: 14 % (ref 11.5–15.5)
WBC: 16.4 10*3/uL — ABNORMAL HIGH (ref 4.0–10.5)
nRBC: 0 % (ref 0.0–0.2)

## 2019-07-05 SURGERY — GASTRECTOMY, SLEEVE, LAPAROSCOPIC
Anesthesia: General

## 2019-07-05 MED ORDER — SUCCINYLCHOLINE CHLORIDE 200 MG/10ML IV SOSY
PREFILLED_SYRINGE | INTRAVENOUS | Status: DC | PRN
  Administered 2019-07-05: 140 mg via INTRAVENOUS

## 2019-07-05 MED ORDER — METOPROLOL TARTRATE 5 MG/5ML IV SOLN: 5.0000 mg | Freq: Four times a day (QID) | INTRAVENOUS | Status: DC | PRN

## 2019-07-05 MED ORDER — SODIUM CHLORIDE (PF) 0.9 % IJ SOLN
INTRAMUSCULAR | Status: AC
  Filled 2019-07-05: qty 10

## 2019-07-05 MED ORDER — GABAPENTIN 100 MG PO CAPS
200.0000 mg | ORAL_CAPSULE | Freq: Two times a day (BID) | ORAL | Status: DC
  Administered 2019-07-05 – 2019-07-06 (×2): 200 mg via ORAL
  Filled 2019-07-05 (×2): qty 2

## 2019-07-05 MED ORDER — HEPARIN SODIUM (PORCINE) 5000 UNIT/ML IJ SOLN
5000.0000 [IU] | INTRAMUSCULAR | Status: AC
  Administered 2019-07-05: 5000 [IU] via SUBCUTANEOUS
  Filled 2019-07-05: qty 1

## 2019-07-05 MED ORDER — ACETAMINOPHEN 160 MG/5ML PO SOLN: 1000.0000 mg | Freq: Three times a day (TID) | ORAL | Status: DC

## 2019-07-05 MED ORDER — LIDOCAINE 2% (20 MG/ML) 5 ML SYRINGE
INTRAMUSCULAR | Status: DC | PRN
  Administered 2019-07-05: 100 mg via INTRAVENOUS

## 2019-07-05 MED ORDER — DULOXETINE HCL 60 MG PO CPEP
60.0000 mg | ORAL_CAPSULE | Freq: Every day | ORAL | Status: DC
  Administered 2019-07-06: 60 mg via ORAL
  Filled 2019-07-05: qty 1

## 2019-07-05 MED ORDER — SODIUM CHLORIDE 0.9 % IV SOLN
2.0000 g | INTRAVENOUS | Status: AC
  Administered 2019-07-05: 16:00:00 2 g via INTRAVENOUS
  Filled 2019-07-05: qty 2

## 2019-07-05 MED ORDER — LIDOCAINE 2% (20 MG/ML) 5 ML SYRINGE
INTRAMUSCULAR | Status: AC
  Filled 2019-07-05: qty 5

## 2019-07-05 MED ORDER — EPHEDRINE SULFATE-NACL 50-0.9 MG/10ML-% IV SOSY
PREFILLED_SYRINGE | INTRAVENOUS | Status: DC | PRN
  Administered 2019-07-05 (×2): 10 mg via INTRAVENOUS

## 2019-07-05 MED ORDER — PANTOPRAZOLE SODIUM 40 MG IV SOLR
40.0000 mg | Freq: Every day | INTRAVENOUS | Status: DC
  Administered 2019-07-05: 40 mg via INTRAVENOUS
  Filled 2019-07-05: qty 40

## 2019-07-05 MED ORDER — CHLORHEXIDINE GLUCONATE CLOTH 2 % EX PADS: 6.0000 | MEDICATED_PAD | Freq: Once | CUTANEOUS | Status: DC

## 2019-07-05 MED ORDER — DULOXETINE HCL 30 MG PO CPEP
30.0000 mg | ORAL_CAPSULE | Freq: Every day | ORAL | Status: DC
  Administered 2019-07-05: 30 mg via ORAL
  Filled 2019-07-05: qty 1

## 2019-07-05 MED ORDER — ONDANSETRON HCL 4 MG/2ML IJ SOLN
INTRAMUSCULAR | Status: DC | PRN
  Administered 2019-07-05: 4 mg via INTRAVENOUS

## 2019-07-05 MED ORDER — ACETAMINOPHEN 500 MG PO TABS
1000.0000 mg | ORAL_TABLET | ORAL | Status: AC
  Administered 2019-07-05: 1000 mg via ORAL

## 2019-07-05 MED ORDER — PROMETHAZINE HCL 25 MG/ML IJ SOLN
INTRAMUSCULAR | Status: AC
  Filled 2019-07-05: qty 1

## 2019-07-05 MED ORDER — ONDANSETRON HCL 4 MG/2ML IJ SOLN
4.0000 mg | INTRAMUSCULAR | Status: DC | PRN
  Administered 2019-07-06: 4 mg via INTRAVENOUS
  Filled 2019-07-05: qty 2

## 2019-07-05 MED ORDER — FENTANYL CITRATE (PF) 100 MCG/2ML IJ SOLN
INTRAMUSCULAR | Status: AC
  Filled 2019-07-05: qty 4

## 2019-07-05 MED ORDER — LACTATED RINGERS IV SOLN
INTRAVENOUS | Status: DC
  Administered 2019-07-05 (×2): via INTRAVENOUS

## 2019-07-05 MED ORDER — LIDOCAINE HCL 2 % IJ SOLN
INTRAMUSCULAR | Status: AC
  Filled 2019-07-05: qty 20

## 2019-07-05 MED ORDER — LACTATED RINGERS IR SOLN
Status: DC | PRN
  Administered 2019-07-05: 1000 mL

## 2019-07-05 MED ORDER — LIDOCAINE 2% (20 MG/ML) 5 ML SYRINGE
INTRAMUSCULAR | Status: DC | PRN
  Administered 2019-07-05: 1.5 mg/kg/h via INTRAVENOUS

## 2019-07-05 MED ORDER — LEVOTHYROXINE SODIUM 100 MCG PO TABS
200.0000 ug | ORAL_TABLET | Freq: Every day | ORAL | Status: DC
  Administered 2019-07-06: 200 ug via ORAL
  Filled 2019-07-05: qty 2

## 2019-07-05 MED ORDER — ENSURE MAX PROTEIN PO LIQD
2.0000 [oz_av] | ORAL | Status: DC
  Administered 2019-07-06 (×3): 2 [oz_av] via ORAL

## 2019-07-05 MED ORDER — SUGAMMADEX SODIUM 200 MG/2ML IV SOLN
INTRAVENOUS | Status: DC | PRN
  Administered 2019-07-05: 200 mg via INTRAVENOUS

## 2019-07-05 MED ORDER — MORPHINE SULFATE (PF) 2 MG/ML IV SOLN: 1.0000 mg | INTRAVENOUS | Status: DC | PRN

## 2019-07-05 MED ORDER — PROPOFOL 10 MG/ML IV BOLUS
INTRAVENOUS | Status: DC | PRN
  Administered 2019-07-05: 200 mg via INTRAVENOUS

## 2019-07-05 MED ORDER — OXYCODONE HCL 5 MG/5ML PO SOLN: 5.0000 mg | Freq: Once | ORAL | Status: DC | PRN

## 2019-07-05 MED ORDER — APREPITANT 40 MG PO CAPS
40.0000 mg | ORAL_CAPSULE | ORAL | Status: AC
  Administered 2019-07-05: 40 mg via ORAL
  Filled 2019-07-05: qty 1

## 2019-07-05 MED ORDER — ACETAMINOPHEN 500 MG PO TABS
1000.0000 mg | ORAL_TABLET | Freq: Once | ORAL | Status: DC
  Filled 2019-07-05: qty 2

## 2019-07-05 MED ORDER — KCL IN DEXTROSE-NACL 20-5-0.45 MEQ/L-%-% IV SOLN
INTRAVENOUS | Status: DC
  Administered 2019-07-05 – 2019-07-06 (×2): via INTRAVENOUS
  Filled 2019-07-05 (×2): qty 1000

## 2019-07-05 MED ORDER — FENTANYL CITRATE (PF) 100 MCG/2ML IJ SOLN
25.0000 ug | INTRAMUSCULAR | Status: DC | PRN
  Administered 2019-07-05: 50 ug via INTRAVENOUS

## 2019-07-05 MED ORDER — FENTANYL CITRATE (PF) 250 MCG/5ML IJ SOLN
INTRAMUSCULAR | Status: AC
  Filled 2019-07-05: qty 5

## 2019-07-05 MED ORDER — PROPOFOL 10 MG/ML IV BOLUS
INTRAVENOUS | Status: AC
  Filled 2019-07-05: qty 20

## 2019-07-05 MED ORDER — ALBUTEROL SULFATE (2.5 MG/3ML) 0.083% IN NEBU: 3.0000 mL | INHALATION_SOLUTION | Freq: Four times a day (QID) | RESPIRATORY_TRACT | Status: DC | PRN

## 2019-07-05 MED ORDER — OXYCODONE HCL 5 MG PO TABS: 5.0000 mg | ORAL_TABLET | Freq: Once | ORAL | Status: DC | PRN

## 2019-07-05 MED ORDER — MIDAZOLAM HCL 2 MG/2ML IJ SOLN
INTRAMUSCULAR | Status: DC | PRN
  Administered 2019-07-05: 2 mg via INTRAVENOUS

## 2019-07-05 MED ORDER — SUCCINYLCHOLINE CHLORIDE 200 MG/10ML IV SOSY
PREFILLED_SYRINGE | INTRAVENOUS | Status: AC
  Filled 2019-07-05: qty 10

## 2019-07-05 MED ORDER — PROMETHAZINE HCL 25 MG/ML IJ SOLN
6.2500 mg | INTRAMUSCULAR | Status: DC | PRN
  Administered 2019-07-05: 12.5 mg via INTRAVENOUS

## 2019-07-05 MED ORDER — MIDAZOLAM HCL 2 MG/2ML IJ SOLN
INTRAMUSCULAR | Status: AC
  Filled 2019-07-05: qty 2

## 2019-07-05 MED ORDER — ACETAMINOPHEN 500 MG PO TABS
1000.0000 mg | ORAL_TABLET | Freq: Three times a day (TID) | ORAL | Status: DC
  Administered 2019-07-05 – 2019-07-06 (×2): 1000 mg via ORAL
  Filled 2019-07-05 (×2): qty 2

## 2019-07-05 MED ORDER — HEPARIN SODIUM (PORCINE) 5000 UNIT/ML IJ SOLN
5000.0000 [IU] | Freq: Three times a day (TID) | INTRAMUSCULAR | Status: DC
  Administered 2019-07-05 – 2019-07-06 (×2): 5000 [IU] via SUBCUTANEOUS
  Filled 2019-07-05 (×2): qty 1

## 2019-07-05 MED ORDER — KETAMINE HCL 10 MG/ML IJ SOLN
INTRAMUSCULAR | Status: DC | PRN
  Administered 2019-07-05: 40 mg via INTRAVENOUS

## 2019-07-05 MED ORDER — TOPIRAMATE 100 MG PO TABS
100.0000 mg | ORAL_TABLET | Freq: Two times a day (BID) | ORAL | Status: DC
  Administered 2019-07-05 – 2019-07-06 (×2): 100 mg via ORAL
  Filled 2019-07-05 (×2): qty 1

## 2019-07-05 MED ORDER — BUPIVACAINE LIPOSOME 1.3 % IJ SUSP
20.0000 mL | Freq: Once | INTRAMUSCULAR | Status: AC
  Administered 2019-07-05: 20 mL
  Filled 2019-07-05: qty 20

## 2019-07-05 MED ORDER — EPHEDRINE 5 MG/ML INJ
INTRAVENOUS | Status: AC
  Filled 2019-07-05: qty 10

## 2019-07-05 MED ORDER — SODIUM CHLORIDE (PF) 0.9 % IJ SOLN
INTRAMUSCULAR | Status: DC | PRN
  Administered 2019-07-05: 10 mL

## 2019-07-05 MED ORDER — DEXAMETHASONE SODIUM PHOSPHATE 10 MG/ML IJ SOLN
INTRAMUSCULAR | Status: DC | PRN
  Administered 2019-07-05: 10 mg via INTRAVENOUS

## 2019-07-05 MED ORDER — 0.9 % SODIUM CHLORIDE (POUR BTL) OPTIME
TOPICAL | Status: DC | PRN
  Administered 2019-07-05: 1000 mL

## 2019-07-05 MED ORDER — OXYCODONE HCL 5 MG/5ML PO SOLN
5.0000 mg | Freq: Four times a day (QID) | ORAL | Status: DC | PRN
  Administered 2019-07-06 (×2): 5 mg via ORAL
  Filled 2019-07-05 (×2): qty 5

## 2019-07-05 MED ORDER — ROCURONIUM BROMIDE 10 MG/ML (PF) SYRINGE
PREFILLED_SYRINGE | INTRAVENOUS | Status: DC | PRN
  Administered 2019-07-05: 50 mg via INTRAVENOUS

## 2019-07-05 MED ORDER — FENTANYL CITRATE (PF) 100 MCG/2ML IJ SOLN
INTRAMUSCULAR | Status: DC | PRN
  Administered 2019-07-05: 100 ug via INTRAVENOUS
  Administered 2019-07-05 (×2): 50 ug via INTRAVENOUS

## 2019-07-05 MED ORDER — GABAPENTIN 300 MG PO CAPS
300.0000 mg | ORAL_CAPSULE | ORAL | Status: AC
  Administered 2019-07-05: 300 mg via ORAL
  Filled 2019-07-05: qty 1

## 2019-07-05 SURGICAL SUPPLY — 61 items
APPLIER CLIP 5 13 M/L LIGAMAX5 (MISCELLANEOUS)
APPLIER CLIP ROT 10 11.4 M/L (STAPLE)
APPLIER CLIP ROT 13.4 12 LRG (CLIP)
BAG LAPAROSCOPIC 12 15 PORT 16 (BASKET) ×1 IMPLANT
BAG RETRIEVAL 12/15 (BASKET) ×2
BAG RETRIEVAL 12/15MM (BASKET) ×1
BLADE SURG 15 STRL LF DISP TIS (BLADE) ×1 IMPLANT
BLADE SURG 15 STRL SS (BLADE) ×2
CABLE HIGH FREQUENCY MONO STRZ (ELECTRODE) ×3 IMPLANT
CLIP APPLIE 5 13 M/L LIGAMAX5 (MISCELLANEOUS) IMPLANT
CLIP APPLIE ROT 10 11.4 M/L (STAPLE) IMPLANT
CLIP APPLIE ROT 13.4 12 LRG (CLIP) IMPLANT
COVER WAND RF STERILE (DRAPES) IMPLANT
DERMABOND ADVANCED (GAUZE/BANDAGES/DRESSINGS) ×4
DERMABOND ADVANCED .7 DNX12 (GAUZE/BANDAGES/DRESSINGS) ×2 IMPLANT
DEVICE SUT QUICK LOAD TK 5 (STAPLE) IMPLANT
DEVICE SUT TI-KNOT TK 5X26 (MISCELLANEOUS) IMPLANT
DEVICE SUTURE ENDOST 10MM (ENDOMECHANICALS) IMPLANT
DEVICE TI KNOT TK5 (MISCELLANEOUS)
DISSECTOR BLUNT TIP ENDO 5MM (MISCELLANEOUS) IMPLANT
ELECT REM PT RETURN 15FT ADLT (MISCELLANEOUS) ×3 IMPLANT
GAUZE SPONGE 4X4 12PLY STRL (GAUZE/BANDAGES/DRESSINGS) IMPLANT
GLOVE BIOGEL M 8.0 STRL (GLOVE) ×3 IMPLANT
GOWN STRL REUS W/TWL XL LVL3 (GOWN DISPOSABLE) ×12 IMPLANT
GRASPER SUT TROCAR 14GX15 (MISCELLANEOUS) ×3 IMPLANT
HANDLE STAPLE EGIA 4 XL (STAPLE) ×3 IMPLANT
HOVERMATT SINGLE USE (MISCELLANEOUS) ×3 IMPLANT
KIT BASIN OR (CUSTOM PROCEDURE TRAY) ×3 IMPLANT
KIT TURNOVER KIT A (KITS) IMPLANT
MARKER SKIN DUAL TIP RULER LAB (MISCELLANEOUS) ×3 IMPLANT
NEEDLE SPNL 22GX3.5 QUINCKE BK (NEEDLE) ×3 IMPLANT
PACK UNIVERSAL I (CUSTOM PROCEDURE TRAY) ×3 IMPLANT
PENCIL SMOKE EVACUATOR (MISCELLANEOUS) IMPLANT
QUICK LOAD TK 5 (STAPLE)
RELOAD TRI 45 ART MED THCK BLK (STAPLE) ×3 IMPLANT
RELOAD TRI 45 ART MED THCK PUR (STAPLE) IMPLANT
RELOAD TRI 60 ART MED THCK BLK (STAPLE) ×3 IMPLANT
RELOAD TRI 60 ART MED THCK PUR (STAPLE) ×12 IMPLANT
SCISSORS LAP 5X45 EPIX DISP (ENDOMECHANICALS) IMPLANT
SET IRRIG TUBING LAPAROSCOPIC (IRRIGATION / IRRIGATOR) ×3 IMPLANT
SET TUBE SMOKE EVAC HIGH FLOW (TUBING) ×3 IMPLANT
SHEARS HARMONIC ACE PLUS 45CM (MISCELLANEOUS) ×3 IMPLANT
SLEEVE ADV FIXATION 5X100MM (TROCAR) ×6 IMPLANT
SLEEVE GASTRECTOMY 36FR VISIGI (MISCELLANEOUS) ×3 IMPLANT
SOL ANTI FOG 6CC (MISCELLANEOUS) ×1 IMPLANT
SOLUTION ANTI FOG 6CC (MISCELLANEOUS) ×2
SPONGE LAP 18X18 RF (DISPOSABLE) ×3 IMPLANT
SUT MNCRL AB 4-0 PS2 18 (SUTURE) ×9 IMPLANT
SUT SURGIDAC NAB ES-9 0 48 120 (SUTURE) IMPLANT
SUT VICRYL 0 TIES 12 18 (SUTURE) ×3 IMPLANT
SYR 10ML ECCENTRIC (SYRINGE) ×3 IMPLANT
SYR 20ML LL LF (SYRINGE) ×3 IMPLANT
TOWEL OR 17X26 10 PK STRL BLUE (TOWEL DISPOSABLE) ×3 IMPLANT
TOWEL OR NON WOVEN STRL DISP B (DISPOSABLE) ×3 IMPLANT
TROCAR ADV FIXATION 5X100MM (TROCAR) ×3 IMPLANT
TROCAR BLADELESS 15MM (ENDOMECHANICALS) ×3 IMPLANT
TROCAR BLADELESS OPT 5 100 (ENDOMECHANICALS) IMPLANT
TUBE CALIBRATION LAPBAND (TUBING) IMPLANT
TUBING CONNECTING 10 (TUBING) ×2 IMPLANT
TUBING CONNECTING 10' (TUBING) ×1
TUBING ENDO SMARTCAP (MISCELLANEOUS) ×3 IMPLANT

## 2019-07-05 NOTE — Transfer of Care (Signed)
Immediate Anesthesia Transfer of Care Note  Patient: [name]  Procedure(s) Performed: LAPAROSCOPIC GASTRIC SLEEVE RESECTION, Upper Endo, Eras Pathway (N/A )  Patient Location: PACU  Anesthesia Type:General  Level of Consciousness: awake, alert  and oriented  Airway & Oxygen Therapy: Patient Spontanous Breathing and Patient connected to nasal cannula oxygen  Post-op Assessment: Report given to RN and Post -op Vital signs reviewed and stable  Post vital signs: Reviewed and stable  Last Vitals:  Vitals Value Taken Time  BP 141/81 07/05/19 1738  Temp    Pulse 85 07/05/19 1740  Resp 15 07/05/19 1740  SpO2 98 % 07/05/19 1740  Vitals shown include unvalidated device data.  Last Pain:  Vitals:   07/05/19 1202  TempSrc: Oral         Complications: No apparent anesthesia complications

## 2019-07-05 NOTE — Discharge Instructions (Signed)
° ° ° °GASTRIC BYPASS/SLEEVE ° Home Care Instructions ° ° These instructions are to help you care for yourself when you go home. ° °Call: If you have any problems. °• Call [phone] and ask for the surgeon on call °• If you need immediate help, come to the ER at [place].  °• Tell the ER staff that you are a new post-op gastric bypass or gastric sleeve patient °  °Signs and symptoms to report: • Severe vomiting or nausea °o If you cannot keep down clear liquids for longer than 1 day, call your surgeon  °• Abdominal pain that does not get better after taking your pain medication °• Fever over 100.4° F with chills °• Heart beating over 100 beats a minute °• Shortness of breath at rest °• Chest pain °•  Redness, swelling, drainage, or foul odor at incision (surgical) sites °•  If your incisions open or pull apart °• Swelling or pain in calf (lower leg) °• Diarrhea (Loose bowel movements that happen often), frequent watery, uncontrolled bowel movements °• Constipation, (no bowel movements for 3 days) if this happens: Pick one °o Milk of Magnesia, 2 tablespoons by mouth, 3 times a day for 2 days if needed °o Stop taking Milk of Magnesia once you have a bowel movement °o Call your doctor if constipation continues °Or °o Miralax  (instead of Milk of Magnesia) following the label instructions °o Stop taking Miralax once you have a bowel movement °o Call your doctor if constipation continues °• Anything you think is not normal °  °Normal side effects after surgery: • Unable to sleep at night or unable to focus °• Irritability or moody °• Being tearful (crying) or depressed °These are common complaints, possibly related to your anesthesia medications that put you to sleep, stress of surgery, and change in lifestyle.  This usually goes away a few weeks after surgery.  If these feelings continue, call your primary care doctor. °  °Wound Care: You may have surgical glue, steri-strips, or staples over your incisions after  surgery °• Surgical glue:  Looks like a clear film over your incisions and will wear off a little at a time °• Steri-strips: Strips of tape over your incisions. You may notice a yellowish color on the skin under the steri-strips. This is used to make the   steri-strips stick better. Do not pull the steri-strips off - let them fall off °• Staples: Staples may be removed before you leave the hospital °o If you go home with staples, call [facility], [phone] at for an appointment with your surgeon’s nurse to have staples removed 10 days after surgery. °• Showering: You may shower two (2) days after your surgery unless your surgeon tells you differently °o Wash gently around incisions with warm soapy water, rinse well, and gently pat dry  °o No tub baths until staples are removed, steri-strips fall off or glue is gone.  °  °Medications: • Medications should be liquid or crushed if larger than the size of a dime °• Extended release pills (medication that release a little bit at a time through the day) should NOT be crushed or cut. (examples include XL, ER, DR, SR) °• Depending on the size and number of medications you take, you may need to space (take a few throughout the day)/change the time you take your medications so that you do not over-fill your pouch (smaller stomach) °• Make sure you follow-up with your primary care doctor to   make medication changes needed during rapid weight loss and life-style changes °• If you have diabetes, follow up with the doctor that orders your diabetes medication(s) within one week after surgery and check your blood sugar regularly. °• Do not drive while taking prescription pain medication  °• It is ok to take Tylenol by the bottle instructions with your pain medicine or instead of your pain medicine as needed.  DO NOT TAKE NSAIDS (EXAMPLES OF NSAIDS:  IBUPROFREN/ NAPROXEN)  °Diet:                    First 2 Weeks ° You will see the dietician t about two (2) weeks  after your surgery. The dietician will increase the types of foods you can eat if you are handling liquids well: °• If you have severe vomiting or nausea and cannot keep down clear liquids lasting longer than 1 day, call your surgeon @ ([phone]) °Protein Shake °• Drink at least 2 ounces of shake 5-6 times per day °• Each serving of protein shakes (usually 8 - 12 ounces) should have: °o 15 grams of protein  °o And no more than 5 grams of carbohydrate  °• Goal for protein each day: °o Men = 80 grams per day °o Women = 60 grams per day °• Protein powder may be added to fluids such as non-fat milk or Lactaid milk or unsweetened Soy/Almond milk (limit to 35 grams added protein powder per serving) ° °Hydration °• Slowly increase the amount of water and other clear liquids as tolerated (See Acceptable Fluids) °• Slowly increase the amount of protein shake as tolerated  °•  Sip fluids slowly and throughout the day.  Do not use straws. °• May use sugar substitutes in small amounts (no more than 6 - 8 packets per day; i.e. Splenda) ° °Fluid Goal °• The first goal is to drink at least 8 ounces of protein shake/drink per day (or as directed by the nutritionist); some examples of protein shakes are Syntrax Nectar, Adkins Advantage, EAS Edge HP, and Unjury. See handout from pre-op Bariatric Education Class: °o Slowly increase the amount of protein shake you drink as tolerated °o You may find it easier to slowly sip shakes throughout the day °o It is important to get your proteins in first °• Your fluid goal is to drink 64 - 100 ounces of fluid daily °o It may take a few weeks to build up to this °• 32 oz (or more) should be clear liquids  °And  °• 32 oz (or more) should be full liquids (see below for examples) °• Liquids should not contain sugar, caffeine, or carbonation ° °Clear Liquids: °• Water or Sugar-free flavored water (i.e. Fruit H2O, Propel) °• Decaffeinated coffee or tea (sugar-free) °• Crystal Lite, Wyler’s Lite,  Minute Maid Lite °• Sugar-free Jell-O °• Bouillon or broth °• Sugar-free Popsicle:   *Less than 20 calories each; Limit 1 per day ° °Full Liquids: °Protein Shakes/Drinks + 2 choices per day of other full liquids °• Full liquids must be: °o No More Than 15 grams of Carbs per serving  °o No More Than 3 grams of Fat per serving °• Strained low-fat cream soup (except Cream of Potato or Tomato) °• Non-Fat milk °• Fat-free Lactaid Milk °• Unsweetened Soy Or Unsweetened Almond Milk °• Low Sugar yogurt (Dannon Lite & Fit, Greek yogurt; Oikos Triple Zero; Chobani Simply 100; Yoplait 100 calorie Greek - No Fruit on the Bottom) ° °  °Vitamins   and Minerals • Start 1 day after surgery unless otherwise directed by your surgeon °• 2 Chewable Bariatric Specific Multivitamin / Multimineral Supplement with iron (Example: Bariatric Advantage Multi EA) °• Chewable Calcium with Vitamin D-3 °(Example: 3 Chewable Calcium Plus 600 with Vitamin D-3) °o Take 500 mg three (3) times a day for a total of 1500 mg each day °o Do not take all 3 doses of calcium at one time as it may cause constipation, and you can only absorb 500 mg  at a time  °o Do not mix multivitamins containing iron with calcium supplements; take 2 hours apart °• Menstruating women and those with a history of anemia (a blood disease that causes weakness) may need extra iron °o Talk with your doctor to see if you need more iron °• Do not stop taking or change any vitamins or minerals until you talk to your dietitian or surgeon °• Your Dietitian and/or surgeon must approve all vitamin and mineral supplements °  °Activity and Exercise: Limit your physical activity as instructed by your doctor.  It is important to continue walking at home.  During this time, use these guidelines: °• Do not lift anything greater than ten (10) pounds for at least two (2) weeks °• Do not go back to work or drive until your surgeon says you can °• You may have sex when you feel comfortable  °o It is  VERY important for female patients to use a reliable birth control method; fertility often increases after surgery  °o All hormonal birth control will be ineffective for 30 days after surgery due to medications given during surgery a barrier method must be used. °o Do not get pregnant for at least 18 months °• Start exercising as soon as your doctor tells you that you can °o Make sure your doctor approves any physical activity °• Start with a simple walking program °• Walk 5-15 minutes each day, 7 days per week.  °• Slowly increase until you are walking 30-45 minutes per day °Consider joining our BELT program. [phone] or email [email or web address] °  °Special Instructions Things to remember: °• Use your CPAP when sleeping if this applies to you ° °• [facility] has two free Bariatric Surgery Support Groups that meet monthly °o The 3rd Thursday of each month, 6 pm, [facility]  °o The 2nd Friday of each month, 11:45 am in the private dining room in the basement of  °• It is very important to keep all follow up appointments with your surgeon, dietitian, primary care physician, and behavioral health practitioner °• Routine follow up schedule with your surgeon include appointments at 2-3 weeks, 6-8 weeks, 6 months, and 1 year at a minimum.  Your surgeon may request to see you more often.   °o After the first year, please follow up with your bariatric surgeon and dietitian at least once a year in order to maintain best weight loss results °[facility]: [phone] °[facility]: [phone] °Bariatric Nurse Coordinator: [phone] °  °   Reviewed and Endorsed  °by [facility] Patient Education Committee, June, 2016 °Edits Approved: Aug, 2018 ° ° ° °

## 2019-07-05 NOTE — Op Note (Signed)
05 July 2019  Surgeon: Kaylyn Lim, MD, FACS  Asst:  Romana Juniper, MD, FACS  Anes:  General endotracheal  Procedure: Laparoscopic sleeve gastrectomy and upper endoscopy  Diagnosis: Morbid obesity  Complications: none  EBL:   8 cc  Description of Procedure:  The patient was take to OR 2 and given general anesthesia.  The abdomen was prepped with Chloroprep and draped sterilely.  A timeout was performed.  Access to the abdomen was achieved with a 5 mm trocar through the left upper quadrant.  Following insufflation, the state of the abdomen was found to be free of adhesions and no dimple was seen at the EG junction.  The ViSiGi 36Fr tube was inserted to deflate the stomach and was pulled back into the esophagus.    The pylorus was identified and we measured 5 cm back and marked the antrum.  At that point we began dissection to take down the greater curvature of the stomach using the Harmonic scalpel.  This dissection was taken all the way up to the left crus.  Posterior attachments of the stomach were also taken down.    The ViSiGi tube was then passed into the antrum and suction applied so that it was snug along the lessor curvature.  The "crow's foot" or incisura was identified.  The sleeve gastrectomy was begun using the [employer] stapler beginning with a 4.5 cm black with tRS followed by a 6 cm black with trs and then purple loads with TRS.  When the sleeve was complete the tube was taken off suction and insufflated briefly.  The tube was withdrawn.  Upper endoscopy was then performed by Dr. Kae Heller.     The specimen was extracted using the bag technique through the 15 trocar site which was closed with a single 0 vicryl.  Local was provided by infiltrating with Exparel as a TAP block and closed 4-0 Monocryl and Dermabond.    Matt B. Hassell Done, [place], [facility], [place]

## 2019-07-05 NOTE — Progress Notes (Signed)
PHARMACY CONSULT FOR:  Risk Assessment for Post-Discharge VTE Following Bariatric Surgery  Post-Discharge VTE Risk Assessment: This patient's probability of 30-day post-discharge VTE is increased due to the factors marked:   Female    Age >/=60 years    BMI >/=50 kg/m2    CHF    Dyspnea at Rest    Paraplegia  x  Non-gastric-band surgery    Operation Time >/=3 hr    Return to OR     Length of Stay >/= 3 d      Hx of VTE   Hypercoagulable condition   Significant venous stasis   Predicted probability of 30-day post-discharge VTE: 0.16%  Other patient-specific factors to consider: N/A   Recommendation for Discharge: -No pharmacologic prophylaxis post-discharge  -Will follow for return to OR and length of stay and update recommendations as needed.    [name] is a 48 y.o. female who underwent laparoscopic sleeve gastrectomy on 07/05/2019.    Case start: 1626 Case end: 1729   Allergies  Allergen Reactions  . Inh [Isoniazid] Swelling  . Zithromax [Azithromycin] Hives    Patient Measurements: Height: 5\' 10"  (177.8 cm) Weight: 255 lb 6.4 oz (115.8 kg) IBW/kg (Calculated) : 68.5 Body mass index is 36.65 kg/m.  Recent Labs    07/05/19 2108  WBC 16.4*  HGB 13.4  13.5  HCT 42.6  43.5  PLT 318  CREATININE 0.78   Estimated Creatinine Clearance: 116.1 mL/min (by C-G formula based on SCr of 0.78 mg/dL).    Past Medical History:  Diagnosis Date  . Allergy   . Arthritis    Rheumatoid Arthritis  . COPD (chronic obstructive pulmonary disease) ([place])   . Depression   . GERD (gastroesophageal reflux disease)   . Hypertension   . Hypothyroidism   . Lupus ([place])   . Migraines   . Positive TB test   . Sjoegren syndrome   . Sleep apnea    uses CPAP  . Thyroid disease    Graves disease radioactive iodine 10/12     Medications Prior to Admission  Medication Sig Dispense Refill Last Dose  . acetaminophen (TYLENOL) 325 MG tablet Take 650 mg by mouth  every 6 (six) hours as needed for moderate pain or headache.    Past Month at Unknown time  . albuterol (PROVENTIL HFA;VENTOLIN HFA) 108 (90 BASE) MCG/ACT inhaler Inhale 1-2 puffs into the lungs every 6 (six) hours as needed for wheezing or shortness of breath. 1 Inhaler 4 Past Month at Unknown time  . Cholecalciferol (VITAMIN D-3 PO) Take 5,000 Units by mouth daily.    Past Week at Unknown time  . DULoxetine (CYMBALTA) 30 MG capsule Take 30 mg by mouth at bedtime.   07/05/2019 at 0600  . DULoxetine (CYMBALTA) 60 MG capsule Take 60 mg by mouth daily.      . Eszopiclone 3 MG TABS Take 3 mg by mouth at bedtime as needed.   07/04/2019 at Unknown time  . folic acid (FOLVITE) 1 MG tablet Take 2 mg by mouth daily.   Past Week at Unknown time  . HYDROcodone-acetaminophen (NORCO/VICODIN) 5-325 MG tablet Take 1 tablet by mouth every 6 (six) hours as needed.   Past Week at Unknown time  . hydroxychloroquine (PLAQUENIL) 200 MG tablet Take 400 mg by mouth daily.   07/05/2019 at 0600  . LamoTRIgine 250 MG TB24 24 hour tablet Take 250 mg by mouth every morning.   0 07/05/2019 at 0600  . levothyroxine (SYNTHROID,  LEVOTHROID) 200 MCG tablet Take 200 mcg by mouth daily before breakfast.    07/05/2019 at 0600  . Magnesium 100 MG CAPS Take 100 mg by mouth at bedtime.    Past Week at Unknown time  . methotrexate (RHEUMATREX) 2.5 MG tablet Take 25 mg by mouth once a week.   5 Past Week at Unknown time  . pantoprazole (PROTONIX) 40 MG tablet Take 1 tablet (40 mg total) by mouth 2 (two) times daily. 180 tablet 1 07/05/2019 at 0600  . Tavaborole (KERYDIN) 5 % SOLN Apply 1 application topically 2 (two) times daily as needed (athlete's foot).    07/04/2019 at Unknown time  . terbinafine (LAMISIL) 250 MG tablet Take 250 mg by mouth 2 (two) times daily as needed (athlete's foot).    07/04/2019 at Unknown time  . Tofacitinib Citrate ER (XELJANZ XR) 11 MG TB24 Take by mouth.   07/04/2019 at Unknown time  . topiramate (TOPAMAX)  100 MG tablet Take 1 tablet (100 mg total) by mouth 2 (two) times daily. 180 tablet 0 07/05/2019 at 0600  . fluticasone-salmeterol (ADVAIR HFA) 115-21 MCG/ACT inhaler Inhale 2 puffs into the lungs 2 (two) times daily. 1 Inhaler 12   . gabapentin (NEURONTIN) 300 MG capsule Take 300 mg by mouth 2 (two) times daily as needed (pain).    More than a month at Unknown time  . ibuprofen (ADVIL) 200 MG tablet Take 800 mg by mouth every 6 (six) hours as needed (pain).    More than a month at Unknown time  . SUMAtriptan (IMITREX) 100 MG tablet Take 50 mg by mouth every 2 (two) hours as needed for migraine or headache. May repeat in 2 hours if headache persists or recurs.   More than a month at 0600       [name] 07/05/2019,10:41 PM

## 2019-07-05 NOTE — Op Note (Signed)
Preoperative diagnosis: laparoscopic sleeve gastrectomy  Postoperative diagnosis: Same   Procedure: Upper endoscopy   Surgeon: Clovis Riley, M.D.  Anesthesia: Gen.   Description of procedure: The endoscopy was placed in the mouth and into the oropharynx and under endoscopic vision it was advanced to the esophagogastric junction which was identified at 38 from the gums.  The pouch was tensely insufflated. No bubbles were seen.  The staple line was hemostatic and the lumen was evenly tubular without undue narrowing, angulation or twisting specifically at the incisura angularis. The lumen was decompressed and the scope was withdrawn without difficulty.    Clovis Riley, M.D. General, Bariatric, & Minimally Invasive Surgery [facility]

## 2019-07-05 NOTE — Interval H&P Note (Signed)
History and Physical Interval Note:  07/05/2019 1:15 PM  [name]  has presented today for surgery, with the diagnosis of Morbid Obesity.  The various methods of treatment have been discussed with the patient and family. After consideration of risks, benefits and other options for treatment, the patient has consented to  Procedure(s): LAPAROSCOPIC GASTRIC SLEEVE RESECTION, Upper Endo, Eras Pathway (N/A) as a surgical intervention.  The patient's history has been reviewed, patient examined, no change in status, stable for surgery.  I have reviewed the patient's chart and labs.  Questions were answered to the patient's satisfaction.     Pedro Earls

## 2019-07-05 NOTE — Anesthesia Procedure Notes (Addendum)
Procedure Name: Intubation Date/Time: 07/05/2019 4:06 PM Performed by: Niel Hummer, CRNA Pre-anesthesia Checklist: Patient identified, Emergency Drugs available, Suction available and Patient being monitored Patient Re-evaluated:Patient Re-evaluated prior to induction Oxygen Delivery Method: Circle system utilized Induction Type: IV induction Ventilation: Mask ventilation without difficulty Laryngoscope Size: Glidescope and 4 Grade View: Grade I Tube type: Oral Tube size: 7.0 mm Number of attempts: 1 Airway Equipment and Method: Stylet and Video-laryngoscopy Placement Confirmation: ETT inserted through vocal cords under direct vision,  positive ETCO2 and breath sounds checked- equal and bilateral Secured at: 20 cm Tube secured with: Tape Dental Injury: Teeth and Oropharynx as per pre-operative assessment  Comments: Elective glidescope

## 2019-07-06 ENCOUNTER — Encounter (HOSPITAL_COMMUNITY): Payer: Self-pay | Admitting: Surgery

## 2019-07-06 LAB — CBC WITH DIFFERENTIAL/PLATELET
Abs Immature Granulocytes: 0.08 10*3/uL — ABNORMAL HIGH (ref 0.00–0.07)
Basophils Absolute: 0 10*3/uL (ref 0.0–0.1)
Basophils Relative: 0 %
Eosinophils Absolute: 0 10*3/uL (ref 0.0–0.5)
Eosinophils Relative: 0 %
HCT: 40.4 % (ref 36.0–46.0)
Hemoglobin: 13 g/dL (ref 12.0–15.0)
Immature Granulocytes: 1 %
Lymphocytes Relative: 5 %
Lymphs Abs: 0.5 10*3/uL — ABNORMAL LOW (ref 0.7–4.0)
MCH: 30.4 pg (ref 26.0–34.0)
MCHC: 32.2 g/dL (ref 30.0–36.0)
MCV: 94.4 fL (ref 80.0–100.0)
Monocytes Absolute: 0.5 10*3/uL (ref 0.1–1.0)
Monocytes Relative: 4 %
Neutro Abs: 10.8 10*3/uL — ABNORMAL HIGH (ref 1.7–7.7)
Neutrophils Relative %: 90 %
Platelets: 318 10*3/uL (ref 150–400)
RBC: 4.28 MIL/uL (ref 3.87–5.11)
RDW: 13.8 % (ref 11.5–15.5)
WBC: 12 10*3/uL — ABNORMAL HIGH (ref 4.0–10.5)
nRBC: 0 % (ref 0.0–0.2)

## 2019-07-06 MED ORDER — OXYCODONE HCL 5 MG PO TABS: 5.0000 mg | ORAL_TABLET | Freq: Four times a day (QID) | ORAL | 0 refills | Status: DC | PRN

## 2019-07-06 MED ORDER — PANTOPRAZOLE SODIUM 40 MG PO TBEC: 40.0000 mg | DELAYED_RELEASE_TABLET | Freq: Every day | ORAL | 0 refills | Status: DC

## 2019-07-06 MED ORDER — ONDANSETRON 4 MG PO TBDP: 4.0000 mg | ORAL_TABLET | Freq: Four times a day (QID) | ORAL | 0 refills | Status: DC | PRN

## 2019-07-06 NOTE — Progress Notes (Signed)
Patient alert and oriented, pain is controlled. Patient is tolerating fluids, advanced to protein shake today, patient is tolerating well.  Reviewed Gastric sleeve discharge instructions with patient and patient is able to articulate understanding.  Provided information on BELT program, Support Group and [facility] outpatient pharmacy. All questions answered, will continue to monitor.  Total fluid intake 687 Per dehydration protocol call back one week post op

## 2019-07-06 NOTE — Anesthesia Postprocedure Evaluation (Signed)
Anesthesia Post Note  Patient: [name]  Procedure(s) Performed: LAPAROSCOPIC GASTRIC SLEEVE RESECTION, Upper Endo, Eras Pathway (N/A )     Patient location during evaluation: PACU Anesthesia Type: General Level of consciousness: awake and alert and oriented Pain management: pain level controlled Vital Signs Assessment: post-procedure vital signs reviewed and stable Respiratory status: spontaneous breathing, nonlabored ventilation and respiratory function stable Cardiovascular status: blood pressure returned to baseline Postop Assessment: no apparent nausea or vomiting Anesthetic complications: no                  Brennan Bailey

## 2019-07-06 NOTE — Progress Notes (Signed)
Discharge instructions discussed with patient, verbalized agreement and understanding 

## 2019-07-06 NOTE — Discharge Summary (Signed)
Physician Discharge Summary  Patient ID: [name] MRN: [id number] DOB/AGE: 1971/05/28 48 y.o.  PCP: Konrad Saha, MD  Admit date: 07/05/2019 Discharge date: 07/06/2019  Admission Diagnoses:  Obesity and RA  Discharge Diagnoses:  same  Active Problems:   S/P laparoscopic sleeve gastrectomy   Surgery:  Lap sleeve gastrectomy  Discharged Condition: improved  Hospital Course:   Had surgery on Monday.  Started on liquids and advanced.  Ready for discharge on Tuesday.    Consults: none  Significant Diagnostic Studies: none    Discharge Exam: Blood pressure (!) 150/76, pulse 89, temperature 98.1 F (36.7 C), temperature source Oral, resp. rate 18, height 5\' 10"  (1.778 m), weight 115.8 kg, SpO2 97 %. Incisions ok  Disposition: Discharge disposition: 01-Home or Self Care       Discharge Instructions    Ambulate hourly while awake   Complete by: As directed    Call MD for:  difficulty breathing, headache or visual disturbances   Complete by: As directed    Call MD for:  persistant dizziness or light-headedness   Complete by: As directed    Call MD for:  persistant nausea and vomiting   Complete by: As directed    Call MD for:  redness, tenderness, or signs of infection (pain, swelling, redness, odor or green/yellow discharge around incision site)   Complete by: As directed    Call MD for:  severe uncontrolled pain   Complete by: As directed    Call MD for:  temperature >101 F   Complete by: As directed    Diet bariatric full liquid   Complete by: As directed    Incentive spirometry   Complete by: As directed    Perform hourly while awake     Allergies as of 07/06/2019      Reactions   Inh [isoniazid] Swelling   Zithromax [azithromycin] Hives      Medication List    STOP taking these medications   HYDROcodone-acetaminophen 5-325 MG tablet Commonly known as: NORCO/VICODIN     TAKE these medications   acetaminophen 325 MG tablet Commonly  known as: TYLENOL Take 650 mg by mouth every 6 (six) hours as needed for moderate pain or headache.   albuterol 108 (90 Base) MCG/ACT inhaler Commonly known as: VENTOLIN HFA Inhale 1-2 puffs into the lungs every 6 (six) hours as needed for wheezing or shortness of breath.   DULoxetine 60 MG capsule Commonly known as: CYMBALTA Take 60 mg by mouth daily.   DULoxetine 30 MG capsule Commonly known as: CYMBALTA Take 30 mg by mouth at bedtime.   Eszopiclone 3 MG Tabs Take 3 mg by mouth at bedtime as needed.   fluticasone-salmeterol 115-21 MCG/ACT inhaler Commonly known as: Advair HFA Inhale 2 puffs into the lungs 2 (two) times daily.   folic acid 1 MG tablet Commonly known as: FOLVITE Take 2 mg by mouth daily.   gabapentin 300 MG capsule Commonly known as: NEURONTIN Take 300 mg by mouth 2 (two) times daily as needed (pain).   hydroxychloroquine 200 MG tablet Commonly known as: PLAQUENIL Take 400 mg by mouth daily.   ibuprofen 200 MG tablet Commonly known as: ADVIL Take 800 mg by mouth every 6 (six) hours as needed (pain). Notes to patient: Avoid NSAIDs for 6-8 weeks after surgery   Kerydin 5 % Soln Generic drug: Tavaborole Apply 1 application topically 2 (two) times daily as needed (athlete's foot).   LamoTRIgine 250 MG Tb24 24 hour tablet Take  250 mg by mouth every morning.   levothyroxine 200 MCG tablet Commonly known as: SYNTHROID Take 200 mcg by mouth daily before breakfast.   Magnesium 100 MG Caps Take 100 mg by mouth at bedtime.   methotrexate 2.5 MG tablet Commonly known as: RHEUMATREX Take 25 mg by mouth once a week.   ondansetron 4 MG disintegrating tablet Commonly known as: ZOFRAN-ODT Take 1 tablet (4 mg total) by mouth every 6 (six) hours as needed for nausea or vomiting.   oxyCODONE 5 MG immediate release tablet Commonly known as: Oxy IR/ROXICODONE Take 1 tablet (5 mg total) by mouth every 6 (six) hours as needed for severe pain. Notes to patient:  Ok to resume hydrocodone as needed after oxycodone completed.     pantoprazole 40 MG tablet Commonly known as: PROTONIX Take 1 tablet (40 mg total) by mouth 2 (two) times daily. What changed: Another medication with the same name was added. Make sure you understand how and when to take each.   pantoprazole 40 MG tablet Commonly known as: PROTONIX Take 1 tablet (40 mg total) by mouth daily. What changed: You were already taking a medication with the same name, and this prescription was added. Make sure you understand how and when to take each.   SUMAtriptan 100 MG tablet Commonly known as: IMITREX Take 50 mg by mouth every 2 (two) hours as needed for migraine or headache. May repeat in 2 hours if headache persists or recurs.   terbinafine 250 MG tablet Commonly known as: LAMISIL Take 250 mg by mouth 2 (two) times daily as needed (athlete's foot).   topiramate 100 MG tablet Commonly known as: TOPAMAX Take 1 tablet (100 mg total) by mouth 2 (two) times daily.   VITAMIN D-3 PO Take 5,000 Units by mouth daily.   Xeljanz XR 11 MG Tb24 Generic drug: Tofacitinib Citrate ER Take by mouth.      Follow-up Information    Surgery, [place]. Go on 07/14/2019.   Specialty: General Surgery Why: at 330 Contact information: [address] [place] [address] [phone]        Carlena Hurl, PA-C. Go on 08/10/2019.   Specialty: General Surgery Why: at [address] information: [place] [address] [phone]           Signed: Pedro Earls 07/06/2019, 10:14 AM

## 2019-07-06 NOTE — Progress Notes (Signed)
Patient started on protein shakes, educated about protein drink intake,patient shows understanding. We will continue to monitor.

## 2019-07-06 NOTE — Progress Notes (Signed)
Patient alert and oriented, Post op day 1.  Provided support and encouragement.  Encouraged pulmonary toilet, ambulation and small sips of liquids.  Completed 12 ounces of bari clear fluid and 11.5 protein shake.  All questions answered.  Will continue to monitor.

## 2019-07-06 NOTE — Progress Notes (Signed)
Nutrition Note  RD consulted for diet education for patient s/p bariatric surgery. At this time, Bariatric nurse coordinator providing education.   If nutrition issues arise, please consult RD.   Clayton Bibles, MS, RD, LDN Inpatient Clinical Dietitian Pager: [phone] After Hours Pager: [phone]

## 2019-07-07 LAB — SURGICAL PATHOLOGY

## 2019-07-12 ENCOUNTER — Telehealth (HOSPITAL_COMMUNITY): Payer: Self-pay

## 2019-07-12 NOTE — Telephone Encounter (Addendum)
Patient called to discuss post bariatric surgery follow up questions.  See below:   1.  Tell me about your pain and pain management?denies  2.  Let's talk about fluid intake.  How much total fluid are you taking in?64  3.  How much protein have you taken in the last 2 days?60  4.  Have you had nausea?  Tell me about when have experienced nausea and what you did to help?denies  5.  Has the frequency or color changed with your urine?light in color  6.  Tell me what your incisions look like?wnl  7.  Have you been passing gas? BM?had bm with stool softner  8.  If a problem or question were to arise who would you call?  Do you know contact numbers for [place], [facility], and [facility]?aware of to all servies  9.  How has the walking going?walking around regularly  10.  How are your vitamins and calcium going?  How are you taking them?taking mvi/ca

## 2019-07-13 ENCOUNTER — Encounter: Payer: Self-pay | Admitting: Certified Nurse Midwife

## 2019-07-13 ENCOUNTER — Other Ambulatory Visit: Payer: Self-pay

## 2019-07-13 ENCOUNTER — Ambulatory Visit (INDEPENDENT_AMBULATORY_CARE_PROVIDER_SITE_OTHER): Payer: BC Managed Care – PPO | Admitting: Certified Nurse Midwife

## 2019-07-13 VITALS — BP 96/59 | HR 98 | Ht 70.0 in | Wt 242.7 lb

## 2019-07-13 DIAGNOSIS — Z1231 Encounter for screening mammogram for malignant neoplasm of breast: Secondary | ICD-10-CM

## 2019-07-13 DIAGNOSIS — Z01419 Encounter for gynecological examination (general) (routine) without abnormal findings: Secondary | ICD-10-CM | POA: Diagnosis not present

## 2019-07-13 NOTE — Progress Notes (Signed)
GYNECOLOGY ANNUAL PREVENTATIVE CARE ENCOUNTER NOTE  History:     [name] is a 48 y.o. G23P1011 female here for a routine annual gynecologic exam.  Current complaints: none.   Denies abnormal vaginal bleeding, discharge, pelvic pain, problems with intercourse or other gynecologic concerns.     PCP: Elie Confer       does desire labs  Social History: Sexual: heterosexual Marital Status: married Living situation: with spouse Occupation: intake Optician, dispensing [employer] Tobacco/alcohol: no tobacco use Illicit drugs: no history of illicit drug use  Gynecologic History No LMP recorded. Patient has had a hysterectomy. Contraception: none and total hystorectomy Last Pap: 2016  Results were: normal per pt Last mammogram: 08/03/2018. Results were: normal  Obstetric History OB History  Gravida Para Term Preterm AB Living  2 1 1  0 1 1  SAB TAB Ectopic Multiple Live Births  0 1 0 0 1    # Outcome Date GA Lbr Len/2nd Weight Sex Delivery Anes PTL Lv  2 Term 10/19/85   7 lb 2 oz (3.232 kg) M Vag-Spont None N LIV  1 TAB             Past Medical History:  Diagnosis Date  . Allergy   . Arthritis    Rheumatoid Arthritis  . COPD (chronic obstructive pulmonary disease) ([place])   . Depression   . GERD (gastroesophageal reflux disease)   . Hypertension   . Hypothyroidism   . Lupus ([place])   . Migraines   . Positive TB test   . Sjoegren syndrome   . Sleep apnea    uses CPAP  . Thyroid disease    Graves disease radioactive iodine 10/12    Past Surgical History:  Procedure Laterality Date  . ABDOMINAL HYSTERECTOMY  2013   and BSO  . ANKLE SURGERY  08/06/2007   left x2  . BILATERAL SALPINGOOPHORECTOMY  2013  . BREAST CYST ASPIRATION Left    neg  . BREAST SURGERY Left 2013   biopsy-benign  . CHOLECYSTECTOMY  1990  . ganglion cyst removal  08/06/2003   left wrist  . LAPAROSCOPIC GASTRIC SLEEVE RESECTION N/A 07/05/2019   Procedure: LAPAROSCOPIC GASTRIC SLEEVE  RESECTION, Upper Endo, Eras Pathway;  Surgeon: Johnathan Hausen, MD;  Location: [facility];  Service: General;  Laterality: N/A;  . OOPHORECTOMY    . TUBAL LIGATION  2000    Current Outpatient Medications on File Prior to Visit  Medication Sig Dispense Refill  . acetaminophen (TYLENOL) 325 MG tablet Take 650 mg by mouth every 6 (six) hours as needed for moderate pain or headache.     . albuterol (PROVENTIL HFA;VENTOLIN HFA) 108 (90 BASE) MCG/ACT inhaler Inhale 1-2 puffs into the lungs every 6 (six) hours as needed for wheezing or shortness of breath. 1 Inhaler 4  . Cholecalciferol (VITAMIN D-3 PO) Take 5,000 Units by mouth daily.     . DULoxetine (CYMBALTA) 30 MG capsule Take 30 mg by mouth at bedtime.    . DULoxetine (CYMBALTA) 60 MG capsule Take 60 mg by mouth daily.     . Eszopiclone 3 MG TABS Take 3 mg by mouth at bedtime as needed.    . fluticasone-salmeterol (ADVAIR HFA) 115-21 MCG/ACT inhaler Inhale 2 puffs into the lungs 2 (two) times daily. 1 Inhaler 12  . folic acid (FOLVITE) 1 MG tablet Take 2 mg by mouth daily.    Marland Kitchen gabapentin (NEURONTIN) 300 MG capsule Take 300 mg by mouth 2 (two) times daily  as needed (pain).     . hydroxychloroquine (PLAQUENIL) 200 MG tablet Take 400 mg by mouth daily.    Marland Kitchen ibuprofen (ADVIL) 200 MG tablet Take 800 mg by mouth every 6 (six) hours as needed (pain).     . LamoTRIgine 250 MG TB24 24 hour tablet Take 250 mg by mouth every morning.   0  . levothyroxine (SYNTHROID, LEVOTHROID) 200 MCG tablet Take 200 mcg by mouth daily before breakfast.     . Magnesium 100 MG CAPS Take 100 mg by mouth at bedtime.     . methotrexate (RHEUMATREX) 2.5 MG tablet Take 25 mg by mouth once a week.   5  . ondansetron (ZOFRAN-ODT) 4 MG disintegrating tablet Take 1 tablet (4 mg total) by mouth every 6 (six) hours as needed for nausea or vomiting. 20 tablet 0  . oxyCODONE (OXY IR/ROXICODONE) 5 MG immediate release tablet Take 1 tablet (5 mg total) by mouth every 6 (six) hours as  needed for severe pain. 10 tablet 0  . pantoprazole (PROTONIX) 40 MG tablet Take 1 tablet (40 mg total) by mouth 2 (two) times daily. 180 tablet 1  . pantoprazole (PROTONIX) 40 MG tablet Take 1 tablet (40 mg total) by mouth daily. 90 tablet 0  . SUMAtriptan (IMITREX) 100 MG tablet Take 50 mg by mouth every 2 (two) hours as needed for migraine or headache. May repeat in 2 hours if headache persists or recurs.    Corrie Dandy (KERYDIN) 5 % SOLN Apply 1 application topically 2 (two) times daily as needed (athlete's foot).     . terbinafine (LAMISIL) 250 MG tablet Take 250 mg by mouth 2 (two) times daily as needed (athlete's foot).     . Tofacitinib Citrate ER (XELJANZ XR) 11 MG TB24 Take by mouth.    . topiramate (TOPAMAX) 100 MG tablet Take 1 tablet (100 mg total) by mouth 2 (two) times daily. 180 tablet 0   No current facility-administered medications on file prior to visit.     Allergies  Allergen Reactions  . Inh [Isoniazid] Swelling  . Zithromax [Azithromycin] Hives    Social History:  reports that she quit smoking about 5 years ago. Her smoking use included cigarettes. She has a 40.00 pack-year smoking history. She has never used smokeless tobacco. She reports current alcohol use. She reports that she does not use drugs.  Family History  Problem Relation Age of Onset  . Cancer Mother 57       fallopian tube  . Arthritis Mother   . Heart disease Mother   . Diabetes Mother   . Hypertension Mother   . Coronary artery disease Father   . Cancer Maternal Aunt        Breast CA  . Diabetes Maternal Grandmother   . Diabetes Maternal Grandfather   . Diabetes Paternal Grandmother   . Diabetes Paternal Grandfather   . Breast cancer Paternal Aunt   . Breast cancer Paternal Aunt   . Breast cancer Paternal Aunt     The following portions of the patient's history were reviewed and updated as appropriate: allergies, current medications, past family history, past medical history, past social  history, past surgical history and problem list.  Review of Systems Pertinent items noted in HPI and remainder of comprehensive ROS otherwise negative.  Physical Exam:  BP (!) 96/59   Pulse 98   Ht 5\' 10"  (1.778 m)   Wt 242 lb 11.2 oz (110.1 kg)   BMI 34.82 kg/m  CONSTITUTIONAL: Well-developed, well-nourished female, obese  in no acute distress.  HENT:  Normocephalic, atraumatic, External right and left ear normal. Oropharynx is clear and moist EYES: Conjunctivae and EOM are normal. Pupils are equal, round, and reactive to light. No scleral icterus.  NECK: Normal range of motion, supple, no masses.  Normal thyroid.  SKIN: Skin is warm and dry. No rash noted. Not diaphoretic. No erythema. No pallor. MUSCULOSKELETAL: Normal range of motion. No tenderness.  No cyanosis, clubbing, or edema.  2+ distal pulses. NEUROLOGIC: Alert and oriented to person, place, and time. Normal reflexes, muscle tone coordination.  PSYCHIATRIC: Normal mood and affect. Normal behavior. Normal judgment and thought content. CARDIOVASCULAR: Normal heart rate noted, regular rhythm RESPIRATORY: Clear to auscultation bilaterally. Effort and breath sounds normal, no problems with respiration noted. BREASTS: Symmetric in size. No masses, tenderness, skin changes, nipple drainage, or lymphadenopathy bilaterally. ABDOMEN: Soft, no distention noted. Scars present from recent gastric sleave surgery.  No tenderness, rebound or guarding.  PELVIC: Normal appearing external genitalia. and urethral meatus; normal appearing vaginal mucosa with normal atrophy. No cervix present   No abnormal discharge noted. Uterus absent, no other palpable masses, nadnexal tenderness.   Assessment and Plan:    Annual [facility] Women Gyn Exam . Mammogram scheduled Labs not due, pt has lipid profile done by another provider. TSH, hem A1 c, CBC all up to date Colonoscopy done.  Routine preventative health maintenance measures emphasized. Please refer to  After Visit Summary for other counseling recommendations.      Philip Aspen, CNM

## 2019-07-13 NOTE — Patient Instructions (Signed)

## 2019-07-13 NOTE — Progress Notes (Signed)
Patient here for annual exam, no complaints.  Had flu shot at work 04/23/2019.

## 2019-07-19 ENCOUNTER — Other Ambulatory Visit: Payer: Self-pay

## 2019-07-19 ENCOUNTER — Encounter: Payer: Self-pay | Admitting: Dietician

## 2019-07-19 ENCOUNTER — Encounter: Payer: BC Managed Care – PPO | Attending: Student | Admitting: Dietician

## 2019-07-19 VITALS — Ht 70.0 in | Wt 241.4 lb

## 2019-07-19 DIAGNOSIS — Z6837 Body mass index (BMI) 37.0-37.9, adult: Secondary | ICD-10-CM

## 2019-07-19 DIAGNOSIS — E669 Obesity, unspecified: Secondary | ICD-10-CM | POA: Diagnosis not present

## 2019-07-19 NOTE — Patient Instructions (Signed)
   Begin adding some solid protein foods, in 1-2oz or 1/4 cup portions. Allow up to 30 minutes to eat what you can, take small bites, chew thoroughly, and avoid fluids during meals.   Use visual strategies to help with eating slowly, such as dividing the portion into smaller amounts.   Add small amounts of low-carb veggies when able to eat at least 1-2 oz of protein first. Start with soft cooked vegetables.

## 2019-07-19 NOTE — Progress Notes (Signed)
Follow-up visit:  2 Weeks Post-Operative Sleeve Gastrectomy Surgery  Medical Nutrition Therapy:  Appt start time: 1600 end time:  [id number].  Primary concerns today: Post-operative Bariatric Surgery Nutrition Management.  Learning Readiness:   Change in progress  Weight: 241.4lbs Height: 5'10" Weight at previous [facility] visit: 258.1lbs  05/21/19 Body Composition: Skeletal muscle mass: 62.4lbs, body fat mass 134.2lbs, 52.5% body fat   Progress:  Patient reports doing well since surgery; she has had occasional diarrhea, but has experienced loose stools at times since having gallbladder removal.   She has dry mouth due to RA medication side effects, so typically drinks plenty of water daily. She has been able to continue to drink more than she expected after surgery.   She reports more difficulty in drinking protein shakes recently due to taste changes. She voices readiness to progress to solid foods; reports craving vegetables especially carrots in past several days.   Dietary recall: 2 premier protein shakes daily, water with sugar free flavoring  Fluid intake: 4-6 17oz bottles daily + 2 protein shakes = 100oz+ Estimated total protein intake: 60g  Medications: albuterol inhaler prn, DULoxetine, Eszopiclone, hydroxychloroquine, lamoTRIgine, levothyroxine, methotrexate, oxyCODONE prn, pantoprazole, tofacitinib Citrate, topiramate Supplementation: bariatric multivitamin with 45mg  iron 2x daily, Calcium citrate 500mg  3x daily  Using straws: no Drinking while eating: no Hair loss: yes, due to thyroid issues Carbonated beverages: no N/V/D/C: occasional diarrhea (including before surgery, since gallbladder removal); no constipation, no vomiting Dumping syndrome: no   Recent physical activity:  PT for elbow; walking in yard; plans to begin light upper body exercises ie Pilates as she is now cleared by MD.   Progress Towards Goal(s):  In progress.  Handouts given during visit include:  Phase  3 and 4 bariatric diet handouts  Goals and instructions (AVS)   Nutritional Diagnosis:  [place]-3.3 Overweight/obesity As related to history of excess calories, inactivity, hypothyroidism.  As evidenced by patient with curent BMI of 34.6, following bariatric diet guidelines for ongoing weight loss after sleeve gastrectomy.    Intervention:    Reviewed progress since surgery.  Instructed on diet advancement to begin solid protein foods; to include low-carb vegetables only when able to consume adequate protein to meet daily goal.   Discussed strategies for adding supplemental protein to foods to ensure adequate daily intake.  Teaching Method Utilized:  Visual Auditory Hands on  Barriers to learning/adherence to lifestyle change: none  Demonstrated degree of understanding via:  Teach Back   Monitoring/Evaluation:  Dietary intake, exercise, and body weight. Follow up in 6 weeks for 2 month post-op visit.

## 2019-07-20 ENCOUNTER — Ambulatory Visit: Payer: BC Managed Care – PPO

## 2019-07-20 ENCOUNTER — Ambulatory Visit: Payer: BC Managed Care – PPO | Admitting: Dietician

## 2019-09-01 ENCOUNTER — Other Ambulatory Visit: Payer: Self-pay

## 2019-09-01 ENCOUNTER — Encounter: Payer: BC Managed Care – PPO | Attending: Student | Admitting: Dietician

## 2019-09-01 ENCOUNTER — Encounter: Payer: Self-pay | Admitting: Dietician

## 2019-09-01 VITALS — Ht 70.0 in | Wt 220.3 lb

## 2019-09-01 DIAGNOSIS — Z6831 Body mass index (BMI) 31.0-31.9, adult: Secondary | ICD-10-CM

## 2019-09-01 DIAGNOSIS — E669 Obesity, unspecified: Secondary | ICD-10-CM

## 2019-09-01 NOTE — Patient Instructions (Signed)
   OK to try small amounts of fruit (with a protein source) to help with constipation.   Continue to expand the low-carb vegetables for fiber.   Keep increasing fluid intake to the 64oz goal (or more).   Attend support group virtual meetings.

## 2019-09-01 NOTE — Progress Notes (Signed)
Follow-up visit:  2 Months Post-Operative Sleeve Gastrectomy Surgery  Medical Nutrition Therapy:  Appt start time: 1600 end time:  [id number].  Primary concerns today: Post-operative Bariatric Surgery Nutrition Management.  Learning Readiness:   Change in progress  Weight: 220.3lbs Height: 5'10" Weight at previous [facility] visit: 241.4lbs Body Composition: skeletal muscle mass: 58.4lbs; body fat 50%    Progress:  Weight loss of 21.1lbs since previous visit on 07/19/19.  Patient reports feeling anxious about whether her weight loss is at goal, and whether she is following diet guidelines correctly.   She reports ongoing constipation, is taking stool softener with stimulant as needed, about once a week. She feels less hungry and thirsty, and sometimes nauseated, if she becomes constipated.   She is using MyFitnessPal to track intake, averaging 700-900kcal daily  Dietary recall: Breakfast: egg with cooking spray, [place] sausage; cottage cheese; lowfat string cheese; occ skips when not hungry due to constipation Snack: string cheese if not eaten earlier  Lunch: protein shake or meat + low-carb veg; salad with grilled chicken , 1-2 Tbsp lowfat dressing,  Snack: protein shake if not for lunch  Dinner: 3oz chicken, filet mignon steak + low-carb veg Snack: sugar free pudding once a week  Fluid intake: 2-3-5 bottles water/ flavored water + 1 protein shake + hot tea with 1 Tbsp creamer and sucralose  Estimated total protein intake: 60+grams daily  Medications: acetaminophen prn, albuterol inhaler prn, chantix, DULoxetine, Eszopiclone, fluticasone-salmeterol inhaler, gabapentin prn, hydroxychloroquine, lamoTRIgine, levothyroxine, methotrexate, pantoprazole, SUMAtriptan, tofacitinib, topiramate  Supplementation: Bariatric multivitamin 2x daily; calcium 500mg  3x daily; folic acid 1mg  daily  Using straws: no Drinking while eating: no Hair loss: no Carbonated beverages: no N/V/D/C: constipation  (nausea when constipated); no vomiting or diarrhea Dumping syndrome: no   Recent physical activity:  Walking 25-40 minutes daily alone or with dogs  Progress Towards Goal(s):  In progress.  Handouts given during visit include:  Phase 4 and 5 bariatric diet handouts  Goals and instructions   Nutritional Diagnosis:  -3.3 Overweight/obesity As related to history of excess calories, inactivity, hypothyroidism.  As evidenced by patient with current BMI of 31.61, following bariatric diet after sleeve gastrectomy.    Intervention:    Reviewed progress since previous visit.  Discussed dietary options for improving constipation, including adequate fluid intake, increasing fiber with veg and adding small amounts of fruits.   Discussed diet advancement for next 4 months; advised avoiding starchy foods and hard, dense foods (ie nuts, seeds) until at least 12-16 weeks post-op.   Patient is following bariatric diet guidelines closely, and is motivated to continue. She would like to add a low-carb bread; discussed options, including using bariatric diet websites for ideas.   Patient does feel the bariatric support group will help her with anxiety about her progress.  Teaching Method Utilized:  Visual Auditory Hands on  Barriers to learning/adherence to lifestyle change: none  Demonstrated degree of understanding via:  Teach Back   Monitoring/Evaluation:  Dietary intake, exercise, and body weight.     Follow up 12/30/19 at 5pm for 6 month post-op visit.

## 2019-12-07 ENCOUNTER — Other Ambulatory Visit: Payer: Self-pay

## 2019-12-07 ENCOUNTER — Ambulatory Visit
Admission: RE | Admit: 2019-12-07 | Discharge: 2019-12-07 | Disposition: A | Discharge status: Home / Self Care | Payer: BC Managed Care – PPO | Source: Ambulatory Visit | Attending: Certified Nurse Midwife | Admitting: Certified Nurse Midwife

## 2019-12-07 DIAGNOSIS — Z01419 Encounter for gynecological examination (general) (routine) without abnormal findings: Secondary | ICD-10-CM | POA: Diagnosis not present

## 2019-12-07 DIAGNOSIS — Z1231 Encounter for screening mammogram for malignant neoplasm of breast: Secondary | ICD-10-CM | POA: Insufficient documentation

## 2019-12-30 ENCOUNTER — Encounter: Payer: BC Managed Care – PPO | Attending: General Surgery | Admitting: Dietician

## 2019-12-30 ENCOUNTER — Encounter: Payer: Self-pay | Admitting: Dietician

## 2019-12-30 VITALS — Ht 70.0 in | Wt 188.1 lb

## 2019-12-30 DIAGNOSIS — E663 Overweight: Secondary | ICD-10-CM

## 2019-12-30 NOTE — Patient Instructions (Signed)
   Great job following bariatric guidelines, keep it up!  Continue to gradually increase physical exercise safely to keep up metabolism and lean body weight

## 2019-12-30 NOTE — Progress Notes (Signed)
Follow-up visit:  6 Months Post-Operative Sleeve Gastrectomy Surgery  Medical Nutrition Therapy:  Appt start time: 1700 end time:  1800.  Primary concerns today: Post-operative Bariatric Surgery Nutrition Management.    Learning Readiness:   Change in progress  Weight: 188.1lbs Height: 5'10" Weight at previous NDES visit: 220lbs Body Composition: skeletal muscle mass: 59.3lbs, increase of 0.9lbs since 08/2019     body fat 41.1%, decrease of 9% since 08/2019    Progress:  Reports some episodes of nausea/vomiting after eating small amount of sub sandwich, thick burger (no bread) or 3 waffle fries from [employer].   Had craving for chocoalte cake and ate a sliver, put another in freezer but gave the rest away. Ate 1-2 Tbsp low-cal ice cream on a separate occasion.   Generally avoids potatoes; has not eaten any chips or crackers.  Reports ongoing struggle to eat slowly  She is meeting with counselor to help with changes in long-term eating behaviors and changing mindset related to food.  Dietary recall: Breakfast: scrambled egg with sausage; protein shake; string cheese; trail mix with nuts and dark choc pieces Snack: celery with peanut butter; dill pickles Lunch: sometimes skips when not hungry; chia seed wrap (very thin) with [place], lowfat cheese; salad with chicken Snack: apple; or protein shake if no lunch Dinner: chicken often occ with pasta or rice + veg-- carrots, celery, zucchini; baked ground chicken (pizza) topped with fat free mozzarella and lowfat ranch sauce -- trying low carb, low fat recipes  Snack: none or protein shake if needed to meet goal  Fluid intake: 80-100oz daily, water or sugar free flavored water Estimated total protein intake: pt estimates about 2oz meat portions with meals -- reports 60+grams daily  Medications: acetaminophen prn, albuterol inhaler prn, DULoxetine, eszopiclone, Advair inhaler, hydroxychloroquine, ibuprofen prn, lamoTRIgine,  levothyroxine, methotrexate, pantoprazole, SUMAtriptan, Tofacitinib, topiramate Supplementation: bariatric multivitamin 2x daily, calcium 3x daily, folic acid 1x daily  Using straws: no Drinking while eating: no Hair loss:  Carbonated beverages:  no N/V/D/C: occasional nausea/ vomiting with certain foods (soon after eating); some episodes of diarrhea or constipation which patient relates to thyroid function.  Dumping syndrome: no   Recent physical activity:  Physical therapy to improve gait, right foot is dropping when walking  Progress Towards Goal(s):  In progress.  Handouts given during visit include:  Phase 6 and 7 bariatric diet handouts  1000-1200kcal bariatric menus (AND)  Goals and Instructions (AVS)   Nutritional Diagnosis:  [place]-3.3 Overweight/obesity As related to history of excess calories and inactivity.  As evidenced by patient with current BMI of 27, following bariatric diet guidelines for ongoing weight loss after bariatric surgery.    Intervention:    Reviewed progress since previous visit.  Commended patient for continued steady weight loss and improved body composition.  Discussed positive behavior strategies to control eating and cravings.   Goal is to continue with current eating pattern and supplementation, and to continue to gradually increase exercise as able, to maintain safety.   Teaching Method Utilized:  Visual Auditory Hands on  Barriers to learning/adherence to lifestyle change: none  Demonstrated degree of understanding via:  Teach Back   Monitoring/Evaluation:  Dietary intake, exercise, and body weight.     Follow up 03/30/20 for 9 month post-op visit.

## 2019-12-31 ENCOUNTER — Other Ambulatory Visit: Payer: Self-pay

## 2020-03-30 ENCOUNTER — Other Ambulatory Visit: Payer: Self-pay

## 2020-03-30 ENCOUNTER — Encounter: Payer: BC Managed Care – PPO | Attending: Surgery | Admitting: Dietician

## 2020-03-30 VITALS — Ht 70.0 in | Wt 168.6 lb

## 2020-03-30 DIAGNOSIS — Z9884 Bariatric surgery status: Secondary | ICD-10-CM

## 2020-03-30 NOTE — Patient Instructions (Signed)
   Great job continuing with healthy eating habits! Congratulations on reaching your goal!!  Continue physical therapy and other activity to keep lean body weight and healthy metabolism

## 2020-03-30 NOTE — Progress Notes (Signed)
Follow-up visit:  9 Months Post-Operative sleeve gastrectomy Surgery  Medical Nutrition Therapy:  Appt start time: 1700 end time:  1730.  Primary concerns today: Post-operative Bariatric Surgery Nutrition Management.   Learning Readiness:   Change in progress  Weight: 168.6lbs Height: 5'10"  Date 05/21/19 07/18/20 12/30/19 03/30/20  BMI 68.1 34.6 27 24.2  Weight (lbs) 258.1 241.4 188.1 168.6  Skeletal muscle (lbs) 68.1 62.4 59.3 56.4  % body fat 52 52.5 41.1 37.1      Progress: . Patient has surpassed her goal weight of 175lbs, and is now in normal BMI range. . She reports increasing calories in recent weeks to slow weight loss. . She has been experiencing thyroid-related symptoms and has had recent medication change to help.  . She continues to have neck and shoulder pain, will be having physical therapy soon to treat frozen shoulder and she hopes once she has improvement she will be able to expand her exercise routine.   Dietary recall: 5x a week: 26meals and 2 snacks; 2x a week (in office), 3-4 snacks (mostly celery) Breakfast: cheese stick, protein shake; occ rice chex with small amount 2% milk; weekends -- eggs and [place] bacon or sausage Snack: celery with peanut butter or nuts with small amount dark chocolate small portion; occ granola bar; instant oatmeal; [employer] protein chips Lunch: 3-inch sub sandwich; oatmeal Snack: same as pm   Dinner: sirloin tips with peppers and onion, rice; chicken and veg 1 Tbsp potatoes; occasionally pasta also in small portion Snack: rare treat low fat, small portion ie halo ice cream  Fluid intake: 4-6 bottles of water daily, 1c coffee with nondairy creamer no sugar (just recently resumed) Estimated total protein intake: 60+ grams daily   Medications: acetaminophen prn, albuterol inhaler prn, DUloxetine, Eszopiclone, hydrOxyzine, lamoTRIgine, methotrexate, pantoprazole, predniSONE, SUMAtriptan prn, Armour thyroid, tofacitinib,  topiramate  Supplementation: bariatric multivitamin 5-8N daily, folic acid, calcium 3x daily, iron, B vitamin supplement  Using straws: no Drinking while eating: no Hair loss: some -- maybe due to thyroid, also Armour medication Carbonated beverages: no N/V/D/C: occasional nausea when eating too quickly or too much; occaisonal constipation/ diarrhea Dumping syndrome: no   Recent physical activity:  Walking (with another person) 2-3x a week; will be resuming PT soon and plans to increase activity gradually with PT and other exercise as able.   Progress Towards Goal(s):  Resolved.  Handouts given during visit include:  Goals and instructions   Nutritional Diagnosis:  [place]-2.1 Inpaired nutrition utilization As related to bariatric surgery.  As evidenced by patient following bariatric diet and supplementation after sleeve gastrectomy.    Intervention:   . Reviewed progress since previous visit. . Congratulated patient for reaching her goal. . Discussed possible additional caloric adjustment to avoid further weight loss and encouraged patient to track intake for several days and aim for goal to match BMR of 1400kcal. Discussed potential need for added calories if her physical activity increases. Advised allowing time for new thyroid treatment to reach full effectiveness before changing diet.    Teaching Method Utilized:  Visual Auditory  Barriers to learning/adherence to lifestyle change: none  Demonstrated degree of understanding via:  Teach Back   Monitoring/Evaluation:  Dietary intake, exercise, and body weight. Follow up in 07/20/20 at 5:00pm for 12 month post-op visit.

## 2020-07-14 ENCOUNTER — Encounter: Payer: BC Managed Care – PPO | Admitting: Certified Nurse Midwife

## 2020-07-17 ENCOUNTER — Other Ambulatory Visit: Payer: Self-pay | Admitting: *Deleted

## 2020-07-17 DIAGNOSIS — R310 Gross hematuria: Secondary | ICD-10-CM

## 2020-07-18 ENCOUNTER — Ambulatory Visit (INDEPENDENT_AMBULATORY_CARE_PROVIDER_SITE_OTHER): Payer: BC Managed Care – PPO | Admitting: Urology

## 2020-07-18 ENCOUNTER — Other Ambulatory Visit: Payer: Self-pay

## 2020-07-18 ENCOUNTER — Encounter: Payer: Self-pay | Admitting: Urology

## 2020-07-18 ENCOUNTER — Other Ambulatory Visit
Admission: RE | Admit: 2020-07-18 | Discharge: 2020-07-18 | Disposition: A | Discharge status: Home / Self Care | Payer: BC Managed Care – PPO | Attending: Urology | Admitting: Urology

## 2020-07-18 VITALS — BP 110/72 | HR 80 | Ht 71.0 in | Wt 159.0 lb

## 2020-07-18 DIAGNOSIS — R3121 Asymptomatic microscopic hematuria: Secondary | ICD-10-CM

## 2020-07-18 DIAGNOSIS — R310 Gross hematuria: Secondary | ICD-10-CM | POA: Insufficient documentation

## 2020-07-18 LAB — URINALYSIS, COMPLETE (UACMP) WITH MICROSCOPIC
Bilirubin Urine: NEGATIVE
Glucose, UA: NEGATIVE mg/dL
Hgb urine dipstick: NEGATIVE
Leukocytes,Ua: NEGATIVE
Nitrite: NEGATIVE
Protein, ur: NEGATIVE mg/dL
Specific Gravity, Urine: 1.025 (ref 1.005–1.030)
pH: 5 (ref 5.0–8.0)

## 2020-07-18 NOTE — Progress Notes (Signed)
07/18/20 3:35 PM   [name] 08-Feb-1971 [id number]  CC: Microscopic hematuria  HPI: I saw [name] in urology clinic today for evaluation of microscopic hematuria.  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  She has a history of spontaneously passed kidney stones, but has never required surgery.  Urinalysis with PCP showed greater than 30 RBCs but was otherwise benign.  She recently had a CT abdomen pelvis with contrast on 02/28/2020 at [facility] that showed no hydronephrosis, masses, or nephrolithiasis.  She denies any gross hematuria.  She denies any significant urinary symptoms of dysuria or incontinence.  She has had some intermittent right lower quadrant pain that prompted the CT this summer.  She currently smokes only intermittently, but was a 1.5 pack a day smoker for 25 years.  She has a history of endometriosis and hysterectomy.  Urinalysis today benign with few bacteria, 0-5 RBCs, 0-5 WBCs, no leukocytes, nitrite negative.  PMH: Past Medical History:  Diagnosis Date  . Allergy   . Arthritis    Rheumatoid Arthritis  . COPD (chronic obstructive pulmonary disease) ([place])   . Depression   . GERD (gastroesophageal reflux disease)   . Hypertension   . Hypothyroidism   . Lupus ([place])   . Migraines   . Positive TB test   . Sjoegren syndrome   . Sleep apnea    uses CPAP  . Thyroid disease    Graves disease radioactive iodine 10/12    Surgical History: Past Surgical History:  Procedure Laterality Date  . ABDOMINAL HYSTERECTOMY  2013   and BSO  . ANKLE SURGERY  08/06/2007   left x2  . BILATERAL SALPINGOOPHORECTOMY  2013  . BREAST CYST ASPIRATION Left    neg  . BREAST SURGERY Left 2013   biopsy-benign  . CHOLECYSTECTOMY  1990  . ganglion cyst removal  08/06/2003   left wrist  . LAPAROSCOPIC GASTRIC SLEEVE RESECTION N/A 07/05/2019   Procedure: LAPAROSCOPIC GASTRIC SLEEVE RESECTION, Upper Endo, Eras Pathway;  Surgeon: Johnathan Hausen, MD;  Location: [facility];  Service: General;  Laterality: N/A;  . OOPHORECTOMY    . TUBAL LIGATION  2000    Family History: Family History  Problem Relation Age of Onset  . Cancer Mother 55       fallopian tube  . Arthritis Mother   . Heart disease Mother   . Diabetes Mother   . Hypertension Mother   . Coronary artery disease Father   . Cancer Maternal Aunt        Breast CA  . Diabetes Maternal Grandmother   . Diabetes Maternal Grandfather   . Diabetes Paternal Grandmother   . Diabetes Paternal Grandfather   . Breast cancer Paternal Aunt   . Breast cancer Paternal Aunt   . Breast cancer Paternal Aunt     Social History:  reports that she has been smoking cigarettes. She has a 40.00 pack-year smoking history. She has never used smokeless tobacco. She reports previous alcohol use. She reports that she does not use drugs.  Physical Exam: BP 110/72   Pulse 80   Ht 5\' 11"  (1.803 m)   Wt 159 lb (72.1 kg)   BMI 22.18 kg/m    Constitutional:  Alert and oriented, No acute distress. Cardiovascular: No clubbing, cyanosis, or edema. Respiratory: Normal respiratory effort, no increased work of breathing. GI: Abdomen is soft, nontender, nondistended, no abdominal masses  Laboratory Data: See HPI  Pertinent Imaging:  CT from [facility] 02/2020 with no hydronephrosis, renal masses, or nephrolithiasis  Assessment & Plan:   49 year old female with extensive smoking history and microscopic hematuria with greater than 30 RBCs on recent UA.  Recent CT abdomen pelvis with contrast scan in July 2021 showed no urologic abnormalities  We discussed common possible etiologies of microscopic hematuria including idiopathic, urolithiasis, medical renal disease, and malignancy. We discussed the new asymptomatic microscopic hematuria guidelines and risk categories of low, intermediate, and high risk that are based on age, risk factors like smoking, and degree of microscopic hematuria. We discussed  work-up can range from repeat urinalysis, renal ultrasound and cystoscopy, to CT urogram and cystoscopy.  She falls into the high risk category, and I recommended proceeding with cystoscopy.  With recent negative CT abdomen pelvis with contrast, will hold off on additional upper tract imaging pending cystoscopy findings  Kaitlyn Madrid, MD 07/18/2020  [place] [address], [place], [place] [address] [phone]

## 2020-07-18 NOTE — Patient Instructions (Signed)

## 2020-07-19 ENCOUNTER — Encounter: Payer: Self-pay | Admitting: Certified Nurse Midwife

## 2020-07-19 ENCOUNTER — Ambulatory Visit (INDEPENDENT_AMBULATORY_CARE_PROVIDER_SITE_OTHER): Payer: BC Managed Care – PPO | Admitting: Certified Nurse Midwife

## 2020-07-19 VITALS — BP 93/45 | HR 84 | Ht 70.0 in | Wt 158.2 lb

## 2020-07-19 DIAGNOSIS — N898 Other specified noninflammatory disorders of vagina: Secondary | ICD-10-CM | POA: Diagnosis not present

## 2020-07-19 DIAGNOSIS — Z01419 Encounter for gynecological examination (general) (routine) without abnormal findings: Secondary | ICD-10-CM | POA: Diagnosis not present

## 2020-07-19 DIAGNOSIS — Z1231 Encounter for screening mammogram for malignant neoplasm of breast: Secondary | ICD-10-CM | POA: Diagnosis not present

## 2020-07-19 NOTE — Patient Instructions (Signed)

## 2020-07-19 NOTE — Progress Notes (Signed)
GYNECOLOGY ANNUAL PREVENTATIVE CARE ENCOUNTER NOTE  History:     [name] is a 49 y.o. G19P1011 female here for a routine annual gynecologic exam.  Current complaints: vaginal dryness with intercourse.   Denies abnormal vaginal bleeding, discharge, pelvic pain, problems with intercourse or other gynecologic concerns.     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 Exercise:2-3 times wkly Smoke/Alcohol/drug use: rare smoking -with stress, occ alcohol. Denies drug use.   Gynecologic History No LMP recorded. Patient has had a hysterectomy. Contraception: status post hysterectomy Last Pap: 2016 Results were: normal  Last mammogram: 12/07/2019. Results were: normal  Obstetric History OB History  Gravida Para Term Preterm AB Living  2 1 1  0 1 1  SAB IAB Ectopic Multiple Live Births  0 1 0 0 1    # Outcome Date GA Lbr Len/2nd Weight Sex Delivery Anes PTL Lv  2 Term 10/19/85 [redacted]w[redacted]d  7 lb 2 oz (3.232 kg) M Vag-Spont None N LIV  1 IAB             Past Medical History:  Diagnosis Date  . Allergy   . Arthritis    Rheumatoid Arthritis  . COPD (chronic obstructive pulmonary disease) ([place])   . Depression   . GERD (gastroesophageal reflux disease)   . Hematuria   . Hypertension   . Hypothyroidism   . Lupus ([place])   . Migraines   . Positive TB test   . Sjoegren syndrome   . Sleep apnea    uses CPAP  . Thyroid disease    Graves disease radioactive iodine 10/12    Past Surgical History:  Procedure Laterality Date  . ABDOMINAL HYSTERECTOMY  2013   and BSO  . ANKLE SURGERY  08/06/2007   left x2  . BILATERAL SALPINGOOPHORECTOMY  2013  . BREAST CYST ASPIRATION Left    neg  . BREAST SURGERY Left 2013   biopsy-benign  . CHOLECYSTECTOMY  1990  . ganglion cyst removal  08/06/2003   left wrist  . LAPAROSCOPIC GASTRIC SLEEVE RESECTION N/A 07/05/2019   Procedure: LAPAROSCOPIC GASTRIC SLEEVE RESECTION, Upper Endo, Eras  Pathway;  Surgeon: Johnathan Hausen, MD;  Location: [facility];  Service: General;  Laterality: N/A;  . OOPHORECTOMY    . TUBAL LIGATION  2000    Current Outpatient Medications on File Prior to Visit  Medication Sig Dispense Refill  . acetaminophen (TYLENOL) 325 MG tablet Take 650 mg by mouth every 6 (six) hours as needed for moderate pain or headache.     . albuterol (PROVENTIL HFA;VENTOLIN HFA) 108 (90 BASE) MCG/ACT inhaler Inhale 1-2 puffs into the lungs every 6 (six) hours as needed for wheezing or shortness of breath. 1 Inhaler 4  . DULoxetine (CYMBALTA) 60 MG capsule Take 60 mg by mouth daily.     . Eszopiclone 3 MG TABS Take 3 mg by mouth at bedtime as needed.    . folic acid (FOLVITE) 1 MG tablet Take 2 mg by mouth daily.    . hydrOXYzine (VISTARIL) 50 MG capsule Take 50 mg by mouth daily as needed.    Marland Kitchen ibuprofen (ADVIL) 200 MG tablet Take 800 mg by mouth every 6 (six) hours as needed (pain).     . LamoTRIgine 250 MG TB24 24 hour tablet Take 250 mg by mouth every morning.   0  . methotrexate (RHEUMATREX) 2.5 MG tablet Take 25 mg by mouth once a week.   5  . Multiple Vitamins-Minerals (  MULTIVITAMIN ADULT PO) Take by mouth. Taking pro health bariatric multivitamin    . pantoprazole (PROTONIX) 40 MG tablet Take 1 tablet (40 mg total) by mouth 2 (two) times daily. 180 tablet 1  . SUMAtriptan (IMITREX) 100 MG tablet Take 50 mg by mouth every 2 (two) hours as needed for migraine or headache. May repeat in 2 hours if headache persists or recurs.    Marland Kitchen thyroid (ARMOUR) 90 MG tablet Armour Thyroid 90 mg tablet  1 TABLET ON AN EMPTY STOMACH ORALLY ONCE A DAY 30 DAY(S)    . Tofacitinib Citrate ER (XELJANZ XR) 11 MG TB24 Take by mouth.    . topiramate (TOPAMAX) 100 MG tablet Take 1 tablet (100 mg total) by mouth 2 (two) times daily. 180 tablet 0   No current facility-administered medications on file prior to visit.    Allergies  Allergen Reactions  . Inh [Isoniazid] Swelling  . Zithromax  [Azithromycin] Hives    Social History:  reports that she has been smoking cigarettes. She has a 40.00 pack-year smoking history. She has never used smokeless tobacco. She reports previous alcohol use. She reports that she does not use drugs.  Family History  Problem Relation Age of Onset  . Cancer Mother 44       fallopian tube  . Arthritis Mother   . Heart disease Mother   . Diabetes Mother   . Hypertension Mother   . Coronary artery disease Father   . Cancer Maternal Aunt        Breast CA  . Diabetes Maternal Grandmother   . Diabetes Maternal Grandfather   . Diabetes Paternal Grandmother   . Diabetes Paternal Grandfather   . Breast cancer Paternal Aunt   . Breast cancer Paternal Aunt   . Breast cancer Paternal Aunt     The following portions of the patient's history were reviewed and updated as appropriate: allergies, current medications, past family history, past medical history, past social history, past surgical history and problem list.  Review of Systems Pertinent items noted in HPI and remainder of comprehensive ROS otherwise negative.  Physical Exam:  BP (!) 93/45   Pulse 84   Ht 5\' 10"  (1.778 m)   Wt 158 lb 4 oz (71.8 kg)   BMI 22.71 kg/m  CONSTITUTIONAL: Well-developed, well-nourished female in no acute distress.  HENT:  Normocephalic, atraumatic, External right and left ear normal. Oropharynx is clear and moist EYES: Conjunctivae and EOM are normal. Pupils are equal, round, and reactive to light. No scleral icterus.  NECK: Normal range of motion, supple, no masses.  Normal thyroid.  SKIN: Skin is warm and dry. No rash noted. Not diaphoretic. No erythema. No pallor. MUSCULOSKELETAL: Normal range of motion. No tenderness.  No cyanosis, clubbing, or edema.  2+ distal pulses. NEUROLOGIC: Alert and oriented to person, place, and time. Normal reflexes, muscle tone coordination.  PSYCHIATRIC: Normal mood and affect. Normal behavior. Normal judgment and thought  content. CARDIOVASCULAR: Normal heart rate noted, regular rhythm RESPIRATORY: Clear to auscultation bilaterally. Effort and breath sounds normal, no problems with respiration noted. BREASTS: Symmetric in size. No masses, tenderness, skin changes, nipple drainage, or lymphadenopathy bilaterally.  ABDOMEN: Soft, no distention noted.  No tenderness, rebound or guarding.  PELVIC: Normal appearing external genitalia and urethral meatus; normal appearing vaginal mucosa, normal atrophic changes. Cervix absent. No abnormal discharge noted.  Pap smear not indicate. Uterus absent, no other palpable masses,  or adnexal tenderness.  .   Assessment and Plan:  1. Women's annual routine gynecological examination  Pap: not indicated,hystorectomy Mammogram :ordered Labs: none due  Refills: none, declines tetanus today, will call to get later Referral: none Discussed use of vaginal moisturizer for daily use and uber lub for intercourse. Routine preventative health maintenance measures emphasized. Please refer to After Visit Summary for other counseling recommendations.      Philip Aspen, CNM [facility]

## 2020-07-20 ENCOUNTER — Other Ambulatory Visit: Payer: Self-pay

## 2020-07-20 ENCOUNTER — Encounter: Payer: Self-pay | Admitting: Dietician

## 2020-07-20 ENCOUNTER — Encounter: Payer: BC Managed Care – PPO | Attending: Surgery | Admitting: Dietician

## 2020-07-20 VITALS — Ht 70.0 in | Wt 158.7 lb

## 2020-07-20 DIAGNOSIS — Z9884 Bariatric surgery status: Secondary | ICD-10-CM | POA: Diagnosis not present

## 2020-07-20 NOTE — Patient Instructions (Addendum)
   Continue to keep any starch portions small, and choose mostly rice, potato, corn, or oat products to limit wheat and/or gluten depending on underlying cause of symptoms.  Keep up the great work to maintain weight and include regular exercise! Awesome job!!

## 2020-07-20 NOTE — Progress Notes (Signed)
Nutrition Therapy for Post-Operative Bariatric Diet Follow-up visit:  12 months post-op sleeve gastrectomy Surgery  Medical Nutrition Therapy:  Appt start time: 1700 end time:  1730.  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 Patient reports recent weight as low as 152lbs; felt she was getting too thin, so she has been incorporating slightly more carbohydrate into her diet. She did have some "treats" during Thanksgiving holiday and has regained a few pounds.  . Her goal is to maintain current weight.  . She has been working with rheumatologist on reducing some medications, and will be working on diet changes to help improve inflammatory response. She does report some aching in joints after consuming some breads and other wheat products, has not had any allergy tests or celiac testing.   Dietary recall: Eating pattern: 3 meals and 1-3 snacks daily Dining out: occasional Breakfast: eggs and cheese sometimes with [place] sausage; oatmeal or granola bar if at work  Snack: protein snack pack with nuts and small amount dark chocolate (1-2 per day); celery about 1 cup and peanut butter; often no snack when at home Lunch: healthy  choice or lean cuisine meal; leftovers ie chicken with veg; or same as snack depending on work schedule Snack: same as am; often none at home Dinner: meat chicken/pork/ steak + veg + small portion starch ie [place] meatballs with small portion pasta Snack: usually none; occasionally halo popsicle or 2-3 spoonfuls of halo ice cream if craving something sweet (1 pint container has lasted 3 months)  Fluid intake: 3-7 bottles water/ flavored water daily (lower end at work when wearing mask) Estimated total protein intake: 60-80g daily; keeps protein shakes to supplement protein intake when needed  Bariatric diet adherence:  . Using straws: only when dining out . Drinking fluids during meals: no . Carbonated beverages: no  Recent physical activity:  Strength training esp with arms, wall situps 2-3x a week, walking with spouse 1 - 1.5 miles 3-4 times a week   Nutrition Intervention:   . Commended patient for progress made and success with reaching goal . Discussed importance of maintaining small portions and emphasis on lean proteins, low-carb vegetables with controlled amounts of healthy carbs.  . Advised choosing mostly rice, potato, corn, and oat based starches, whole grain, to avoid symptoms that seem to be associated with wheat products. Discussed anti-inflammatory foods.  . Discussed dealing with food temptations and cravings and avoiding backsliding to include unhealthy choices on a regular basis.   Nutritional Diagnosis:  [place]-3.3 Overweight/obesity As related to history of excess calories, inactivity, and hypothyroid disease.  As evidenced by patient with history of high BMI, following bariatric diet guidelines for maintaining current healthy weight after bariatric surgery.  Teaching Method Utilized:  Ship broker  Materials provided:  Phase 7 bariatric diet handout  Bariatric menus for 1200-1500kcal for  weight maintenance (AND)  Learning Readiness:   Change in  progress  Barriers to learning/adherence to lifestyle change: none  Demonstrated degree of understanding via:  Teach Back      Plan: . Patient to call with any questions or concerns, to schedule MNT follow up as needed.

## 2020-07-24 ENCOUNTER — Other Ambulatory Visit: Payer: Self-pay | Admitting: *Deleted

## 2020-07-24 DIAGNOSIS — R310 Gross hematuria: Secondary | ICD-10-CM

## 2020-07-25 ENCOUNTER — Encounter: Payer: Self-pay | Admitting: Urology

## 2020-07-25 ENCOUNTER — Other Ambulatory Visit: Payer: Self-pay

## 2020-07-25 ENCOUNTER — Other Ambulatory Visit
Admission: RE | Admit: 2020-07-25 | Discharge: 2020-07-25 | Disposition: A | Discharge status: Home / Self Care | Payer: BC Managed Care – PPO | Attending: Urology | Admitting: Urology

## 2020-07-25 ENCOUNTER — Other Ambulatory Visit: Payer: Self-pay | Admitting: Urology

## 2020-07-25 ENCOUNTER — Ambulatory Visit (INDEPENDENT_AMBULATORY_CARE_PROVIDER_SITE_OTHER): Payer: BC Managed Care – PPO | Admitting: Urology

## 2020-07-25 VITALS — BP 105/68 | HR 90 | Ht 71.0 in | Wt 157.0 lb

## 2020-07-25 DIAGNOSIS — R310 Gross hematuria: Secondary | ICD-10-CM | POA: Diagnosis present

## 2020-07-25 DIAGNOSIS — R3121 Asymptomatic microscopic hematuria: Secondary | ICD-10-CM | POA: Diagnosis not present

## 2020-07-25 LAB — URINALYSIS, COMPLETE (UACMP) WITH MICROSCOPIC
Bacteria, UA: NONE SEEN
Bilirubin Urine: NEGATIVE
Glucose, UA: NEGATIVE mg/dL
Ketones, ur: NEGATIVE mg/dL
Leukocytes,Ua: NEGATIVE
Nitrite: NEGATIVE
Protein, ur: NEGATIVE mg/dL
Specific Gravity, Urine: 1.01 (ref 1.005–1.030)
Squamous Epithelial / HPF: NONE SEEN (ref 0–5)
WBC, UA: NONE SEEN WBC/hpf (ref 0–5)
pH: 5.5 (ref 5.0–8.0)

## 2020-07-25 NOTE — Progress Notes (Signed)
Cystoscopy Procedure Note:  Indication: Microscopic hematuria  After informed consent and discussion of the procedure and its risks, [name] was positioned and prepped in the standard fashion. Cystoscopy was performed with a flexible cystoscope. The urethra, bladder neck and entire bladder was visualized in a standard fashion. The ureteral orifices were visualized in their normal location and orientation. Bladder mucosa grossly normal throughout, no abnormalities on retroflexion.   Cytology sent in setting of no delayed CT films.  Imaging: CT A/P w/contrast 02/2020 with no urologic abnormalities  Findings: Normal cystoscopy  Assessment and Plan: Call w ith cytology results  Nickolas Madrid, MD 07/25/2020

## 2020-07-25 NOTE — Addendum Note (Signed)
Addended by: Despina Hidden on: 07/25/2020 01:35 PM   Modules accepted: Orders

## 2020-07-27 LAB — CYTOLOGY - NON PAP

## 2020-08-01 ENCOUNTER — Telehealth: Payer: Self-pay

## 2020-08-01 NOTE — Telephone Encounter (Signed)
Called pt no answer. Unable to leave message per DPR. 1st attempt. Mychart message sent.

## 2020-08-01 NOTE — Telephone Encounter (Signed)
-----   Message from Tiernan Come, MD sent at 08/01/2020  8:14 AM EST ----- No cancer cells on urine cytology, follow up as needed  Legrand Rams, MD 08/01/2020

## 2021-01-24 ENCOUNTER — Encounter (HOSPITAL_COMMUNITY): Payer: Self-pay | Admitting: *Deleted

## 2021-06-11 ENCOUNTER — Other Ambulatory Visit: Payer: Self-pay

## 2021-06-11 ENCOUNTER — Ambulatory Visit
Admission: RE | Admit: 2021-06-11 | Discharge: 2021-06-11 | Disposition: A | Discharge status: Home / Self Care | Payer: BC Managed Care – PPO | Source: Ambulatory Visit | Attending: Certified Nurse Midwife | Admitting: Certified Nurse Midwife

## 2021-06-11 DIAGNOSIS — Z1231 Encounter for screening mammogram for malignant neoplasm of breast: Secondary | ICD-10-CM | POA: Insufficient documentation

## 2021-06-11 DIAGNOSIS — Z01419 Encounter for gynecological examination (general) (routine) without abnormal findings: Secondary | ICD-10-CM

## 2021-08-01 ENCOUNTER — Other Ambulatory Visit: Payer: Self-pay

## 2021-08-01 ENCOUNTER — Encounter: Payer: Self-pay | Admitting: Certified Nurse Midwife

## 2021-08-01 ENCOUNTER — Ambulatory Visit (INDEPENDENT_AMBULATORY_CARE_PROVIDER_SITE_OTHER): Payer: BC Managed Care – PPO | Admitting: Certified Nurse Midwife

## 2021-08-01 VITALS — BP 112/71 | HR 93 | Ht 70.0 in | Wt 166.1 lb

## 2021-08-01 DIAGNOSIS — Z1231 Encounter for screening mammogram for malignant neoplasm of breast: Secondary | ICD-10-CM | POA: Diagnosis not present

## 2021-08-01 DIAGNOSIS — Z01419 Encounter for gynecological examination (general) (routine) without abnormal findings: Secondary | ICD-10-CM

## 2021-08-01 NOTE — Progress Notes (Signed)
GYNECOLOGY ANNUAL PREVENTATIVE CARE ENCOUNTER NOTE  History:     [name] is a 50 y.o. G19P1011 female here for a routine annual gynecologic exam.  Current complaints: none.   Denies abnormal vaginal bleeding, discharge, pelvic pain, problems with intercourse or other gynecologic concerns.     Social Relationship: married  Living:spouse Work: Copywriter, advertising Exercise: walking daily  Smoke/Alcohol/drug use: occasional alcohol use.  Gynecologic History No LMP recorded. Patient has had a hysterectomy. Contraception: status post hysterectomy Last Pap: 2016. Results were: normal with negative HPV (hysterectomy) Last mammogram: 06/2021. Results were: normal  Obstetric History OB History  Gravida Para Term Preterm AB Living  2 1 1  0 1 1  SAB IAB Ectopic Multiple Live Births  0 1 0 0 1    # Outcome Date GA Lbr Len/2nd Weight Sex Delivery Anes PTL Lv  2 Term 10/19/85 [redacted]w[redacted]d  7 lb 2 oz (3.232 kg) M Vag-Spont None N LIV  1 IAB             Past Medical History:  Diagnosis Date   Allergy    Arthritis    Rheumatoid Arthritis   COPD (chronic obstructive pulmonary disease) ([place])    Depression    GERD (gastroesophageal reflux disease)    Hematuria    Hypertension    Hypothyroidism    Lupus ([place])    Migraines    Positive TB test    Sjoegren syndrome    Sleep apnea    uses CPAP   Thyroid disease    Graves disease radioactive iodine 10/12    Past Surgical History:  Procedure Laterality Date   ABDOMINAL HYSTERECTOMY  2013   and BSO   ANKLE SURGERY  08/06/2007   left x2   BILATERAL SALPINGOOPHORECTOMY  2013   BREAST CYST ASPIRATION Left    neg   BREAST SURGERY Left 2013   biopsy-benign   CHOLECYSTECTOMY  1990   ganglion cyst removal  08/06/2003   left wrist   LAPAROSCOPIC GASTRIC SLEEVE RESECTION N/A 07/05/2019   Procedure: LAPAROSCOPIC GASTRIC SLEEVE RESECTION, Upper Endo, Eras Pathway;  Surgeon: Johnathan Hausen, MD;  Location: [facility];   Service: General;  Laterality: N/A;   OOPHORECTOMY     TUBAL LIGATION  2000    Current Outpatient Medications on File Prior to Visit  Medication Sig Dispense Refill   acetaminophen (TYLENOL) 325 MG tablet Take 650 mg by mouth every 6 (six) hours as needed for moderate pain or headache.      albuterol (PROVENTIL HFA;VENTOLIN HFA) 108 (90 BASE) MCG/ACT inhaler Inhale 1-2 puffs into the lungs every 6 (six) hours as needed for wheezing or shortness of breath. 1 Inhaler 4   DULoxetine (CYMBALTA) 60 MG capsule Take 60 mg by mouth daily.      Eszopiclone 3 MG TABS Take 3 mg by mouth at bedtime as needed.     folic acid (FOLVITE) 1 MG tablet Take 2 mg by mouth daily.     hydrOXYzine (VISTARIL) 50 MG capsule Take 50 mg by mouth daily as needed.     ibuprofen (ADVIL) 200 MG tablet Take 800 mg by mouth every 6 (six) hours as needed (pain).      LamoTRIgine 250 MG TB24 24 hour tablet Take 250 mg by mouth every morning.   0   methotrexate (RHEUMATREX) 2.5 MG tablet Take 25 mg by mouth once a week.   5   Multiple Vitamins-Minerals (MULTIVITAMIN ADULT PO) Take by mouth.  Taking pro health bariatric multivitamin     pantoprazole (PROTONIX) 40 MG tablet Take 1 tablet (40 mg total) by mouth 2 (two) times daily. 180 tablet 1   SUMAtriptan (IMITREX) 100 MG tablet Take 50 mg by mouth every 2 (two) hours as needed for migraine or headache. May repeat in 2 hours if headache persists or recurs.     thyroid (ARMOUR) 90 MG tablet Armour Thyroid 90 mg tablet  1 TABLET ON AN EMPTY STOMACH ORALLY ONCE A DAY 30 DAY(S)     Tofacitinib Citrate ER (XELJANZ XR) 11 MG TB24 Take by mouth.     topiramate (TOPAMAX) 100 MG tablet Take 1 tablet (100 mg total) by mouth 2 (two) times daily. 180 tablet 0   No current facility-administered medications on file prior to visit.    Allergies  Allergen Reactions   Inh [Isoniazid] Swelling   Zithromax [Azithromycin] Hives    Social History:  reports that she has been smoking  cigarettes. She has a 40.00 pack-year smoking history. She has never used smokeless tobacco. She reports that she does not currently use alcohol. She reports that she does not use drugs.  Family History  Problem Relation Age of Onset   Cancer Mother 84       fallopian tube   Arthritis Mother    Heart disease Mother    Diabetes Mother    Hypertension Mother    Coronary artery disease Father    Cancer Maternal Aunt        Breast CA   Diabetes Maternal Grandmother    Diabetes Maternal Grandfather    Diabetes Paternal Grandmother    Diabetes Paternal Grandfather    Breast cancer Paternal Aunt    Breast cancer Paternal Aunt    Breast cancer Paternal Aunt     The following portions of the patient's history were reviewed and updated as appropriate: allergies, current medications, past family history, past medical history, past social history, past surgical history and problem list.  Review of Systems Pertinent items noted in HPI and remainder of comprehensive ROS otherwise negative.  Physical Exam:  BP 112/71    Pulse 93    Ht 5\' 10"  (1.778 m)    Wt 166 lb 1.6 oz (75.3 kg)    BMI 23.83 kg/m  CONSTITUTIONAL: Well-developed, well-nourished female in no acute distress.  HENT:  Normocephalic, atraumatic, External right and left ear normal. Oropharynx is clear and moist EYES: Conjunctivae and EOM are normal. Pupils are equal, round, and reactive to light. No scleral icterus.  NECK: Normal range of motion, supple, no masses.  Normal thyroid.  SKIN: Skin is warm and dry. No rash noted. Not diaphoretic. No erythema. No pallor. MUSCULOSKELETAL: Normal range of motion. No tenderness.  No cyanosis, clubbing, or edema.  2+ distal pulses. NEUROLOGIC: Alert and oriented to person, place, and time. Normal reflexes, muscle tone coordination.  PSYCHIATRIC: Normal mood and affect. Normal behavior. Normal judgment and thought content. CARDIOVASCULAR: Normal heart rate noted, regular rhythm RESPIRATORY:  Clear to auscultation bilaterally. Effort and breath sounds normal, no problems with respiration noted. BREASTS: Symmetric in size. No masses, tenderness, skin changes, nipple drainage, or lymphadenopathy bilaterally.  ABDOMEN: Soft, no distention noted.  No tenderness, rebound or guarding.  PELVIC: Normal appearing external genitalia and urethral meatus; normal appearing atrophy- vaginal mucosa and vaginal cuff.  No abnormal discharge noted.  Pap smear not indicated. No cervix  Uterus absent, no other palpable masses, no adnexal tenderness.     Assessment  and Plan:    1. Well woman exam with routine gynecological exam   Pap: n/a  Mammogram : ordered Labs: up to date Refills: none  Referral: none Routine preventative health maintenance measures emphasized. Please refer to After Visit Summary for other counseling recommendations.      Philip Aspen, CNM [facility]

## 2022-07-17 ENCOUNTER — Telehealth: Payer: Self-pay | Admitting: Certified Nurse Midwife

## 2022-07-17 NOTE — Telephone Encounter (Signed)
Called patient to get reschedule for her appt on 08-06-22 with Deneise Lever. Left patient a message to call back so we can get her reschedule.

## 2022-07-30 NOTE — Telephone Encounter (Signed)
Patient rescheduled for 08/28/22 with Deneise Lever.

## 2022-08-06 ENCOUNTER — Encounter: Payer: Self-pay | Admitting: Certified Nurse Midwife

## 2022-08-28 ENCOUNTER — Other Ambulatory Visit (HOSPITAL_COMMUNITY)
Admission: RE | Admit: 2022-08-28 | Discharge: 2022-08-28 | Disposition: A | Discharge status: Home / Self Care | Payer: BC Managed Care – PPO | Source: Ambulatory Visit | Attending: Certified Nurse Midwife | Admitting: Certified Nurse Midwife

## 2022-08-28 ENCOUNTER — Encounter: Payer: Self-pay | Admitting: Certified Nurse Midwife

## 2022-08-28 ENCOUNTER — Ambulatory Visit (INDEPENDENT_AMBULATORY_CARE_PROVIDER_SITE_OTHER): Payer: BC Managed Care – PPO | Admitting: Certified Nurse Midwife

## 2022-08-28 VITALS — BP 107/70 | HR 78 | Resp 16 | Ht 71.0 in | Wt 183.1 lb

## 2022-08-28 DIAGNOSIS — Z01419 Encounter for gynecological examination (general) (routine) without abnormal findings: Secondary | ICD-10-CM | POA: Diagnosis not present

## 2022-08-28 DIAGNOSIS — N898 Other specified noninflammatory disorders of vagina: Secondary | ICD-10-CM

## 2022-08-28 DIAGNOSIS — Z1231 Encounter for screening mammogram for malignant neoplasm of breast: Secondary | ICD-10-CM

## 2022-08-28 NOTE — Progress Notes (Signed)
GYNECOLOGY ANNUAL PREVENTATIVE CARE ENCOUNTER NOTE  History:     [name] is a 52 y.o. G23P1011 female here for a routine annual gynecologic exam.  Current complaints: none.   Denies abnormal vaginal bleeding, discharge, pelvic pain, problems with intercourse or other gynecologic concerns.     Social Relationship: married  Living: spouse  Work: Production assistant, radio  Exercise: walking  Smoke/Alcohol/drug use: occasional alcohol use   Gynecologic History No LMP recorded. Patient has had a hysterectomy. Contraception: status post hysterectomy Last Pap: 2018. Results were: normal with negative HPV Last mammogram: 06/11/2021. Results were: normal Colon: 2021 [facility]  Obstetric History OB History  Gravida Para Term Preterm AB Living  '2 1 1 '$ 0 1 1  SAB IAB Ectopic Multiple Live Births  0 1 0 0 1    # Outcome Date GA Lbr Len/2nd Weight Sex Delivery Anes PTL Lv  2 Term 10/19/85 [redacted]w[redacted]d 7 lb 2 oz (3.232 kg) M Vag-Spont None N LIV  1 IAB             Past Medical History:  Diagnosis Date   Allergy    Arthritis    Rheumatoid Arthritis   COP[place] disease) ([place]    Depression    GERD (gastroesophageal reflux disease)    Hematuria    Hypertension    Hypothyroidism    Lupus ([place]    Migraines    Positive TB test    Sjoegren syndrome    Sleep apnea    uses CPAP   Thyroid disease    Graves disease radioactive iodine 10/12    Past Surgical History:  Procedure Laterality Date   ABDOMINAL HYSTERECTOMY  2013   and BSO   ANKLE SURGERY  08/06/2007   left x2   BILATERAL SALPINGOOPHORECTOMY  2013   BREAST CYST ASPIRATION Left    neg   BREAST SURGERY Left 2013   biopsy-benign   CHOLECYSTECTOMY  1990   ganglion cyst removal  08/06/2003   left wrist   LAPAROSCOPIC GASTRIC SLEEVE RESECTION N/A 07/05/2019   Procedure: LAPAROSCOPIC GASTRIC SLEEVE RESECTION, Upper Endo, Eras Pathway;  Surgeon: MJohnathan Hausen MD;  Location: [facility];  Service: General;  Laterality: N/A;   OOPHORECTOMY     TUBAL LIGATION  2000    Current Outpatient Medications on File Prior to Visit  Medication Sig Dispense Refill   acetaminophen (TYLENOL) 325 MG tablet Take 650 mg by mouth every 6 (six) hours as needed for moderate pain or headache.      albuterol (PROVENTIL HFA;VENTOLIN HFA) 108 (90 BASE) MCG/ACT inhaler Inhale 1-2 puffs into the lungs every 6 (six) hours as needed for wheezing or shortness of breath. 1 Inhaler 4   DULoxetine (CYMBALTA) 60 MG capsule Take 60 mg by mouth daily.      Eszopiclone 3 MG TABS Take 3 mg by mouth at bedtime as needed.     fluticasone-salmeterol (ADVAIR HFA) 115-21 MCG/ACT inhaler Inhale 2 puffs into the lungs 2 (two) times daily.     folic acid (FOLVITE) 1 MG tablet Take 2 mg by mouth daily.     hydrOXYzine (VISTARIL) 50 MG capsule Take 50 mg by mouth daily as needed.     ibuprofen (ADVIL) 200 MG tablet Take 800 mg by mouth every 6 (six) hours as needed (pain).      LamoTRIgine 250 MG TB24 24 hour tablet Take 250 mg by mouth every morning.  0   levothyroxine (SYNTHROID) 125 MCG tablet Take 125 mcg by mouth daily before breakfast.     liothyronine (CYTOMEL) 5 MCG tablet Take 5 mcg by mouth daily.     methotrexate (RHEUMATREX) 2.5 MG tablet Take 25 mg by mouth once a week.   5   Multiple Vitamins-Minerals (MULTIVITAMIN ADULT PO) Take by mouth. Taking pro health bariatric multivitamin     pantoprazole (PROTONIX) 40 MG tablet Take 1 tablet (40 mg total) by mouth 2 (two) times daily. 180 tablet 1   pilocarpine (SALAGEN) 5 MG tablet Take by mouth 2 (two) times daily.     Tofacitinib Citrate ER (XELJANZ XR) 11 MG TB24 Take by mouth.     topiramate (TOPAMAX) 100 MG tablet Take 1 tablet (100 mg total) by mouth 2 (two) times daily. 180 tablet 0   No current facility-administered medications on file prior to visit.    Allergies  Allergen Reactions   Inh [Isoniazid] Swelling   Zithromax [Azithromycin] Hives     Social History:  reports that she has been smoking cigarettes. She has a 40.00 pack-year smoking history. She has never used smokeless tobacco. She reports that she does not currently use alcohol. She reports that she does not use drugs.  Family History  Problem Relation Age of Onset   Cancer Mother 66       fallopian tube   Arthritis Mother    Heart disease Mother    Diabetes Mother    Hypertension Mother    Coronary artery disease Father    Cancer Maternal Aunt        Breast CA   Diabetes Maternal Grandmother    Diabetes Maternal Grandfather    Diabetes Paternal Grandmother    Diabetes Paternal Grandfather    Breast cancer Paternal Aunt    Breast cancer Paternal Aunt    Breast cancer Paternal Aunt     The following portions of the patient's history were reviewed and updated as appropriate: allergies, current medications, past family history, past medical history, past social history, past surgical history and problem list.  Review of Systems Pertinent items noted in HPI and remainder of comprehensive ROS otherwise negative.  Physical Exam:  BP 107/70   Pulse 78   Resp 16   Ht '5\' 11"'$  (1.803 m)   Wt 183 lb 1.6 oz (83.1 kg)   BMI 25.54 kg/m  CONSTITUTIONAL: Well-developed, well-nourished female in no acute distress.  HENT:  Normocephalic, atraumatic, External right and left ear normal. Oropharynx is clear and moist EYES: Conjunctivae and EOM are normal. Pupils are equal, round, and reactive to light. No scleral icterus.  NECK: Normal range of motion, supple, no masses.  Normal thyroid.  SKIN: Skin is warm and dry. No rash noted. Not diaphoretic. No erythema. No pallor. MUSCULOSKELETAL: Normal range of motion. No tenderness.  No cyanosis, clubbing, or edema.  2+ distal pulses. NEUROLOGIC: Alert and oriented to person, place, and time. Normal reflexes, muscle tone coordination.  PSYCHIATRIC: Normal mood and affect. Normal behavior. Normal judgment and thought  content. CARDIOVASCULAR: Normal heart rate noted, regular rhythm RESPIRATORY: Clear to auscultation bilaterally. Effort and breath sounds normal, no problems with respiration noted. BREASTS: Symmetric in size. No masses, tenderness, skin changes, nipple drainage, or lymphadenopathy bilaterally.  ABDOMEN: Soft, no distention noted.  No tenderness, rebound or guarding.  PELVIC: Normal appearing external genitalia and urethral meatus; normal appearing vaginal mucosa and cervix.  abnormal discharge noted- Yellow in color no odor. Swab collected .  Pap smear obtained.  Normal uterine size, no other palpable masses, no uterine or adnexal tenderness.  .   Assessment and Plan:    1. Women's annual routine gynecological examination  Pap: n/a  Mammogram :  ordered  Labs: has done with PCP Refills: none  Colonoscopy done 2021 [facility] [address]  Referral: none  Routine preventative health maintenance measures emphasized. Please refer to After Visit Summary for other counseling recommendations.      Philip Aspen, [facility]  [facility]

## 2022-08-28 NOTE — Patient Instructions (Signed)
Preventive Care 40-54 Years Old, Female Preventive care refers to lifestyle choices and visits with your health care provider that can promote health and wellness. Preventive care visits are also called wellness exams. What can I expect for my preventive care visit? Counseling Your health care provider may ask you questions about your: Medical history, including: Past medical problems. Family medical history. Pregnancy history. Current health, including: Menstrual cycle. Method of birth control. Emotional well-being. Home life and relationship well-being. Sexual activity and sexual health. Lifestyle, including: Alcohol, nicotine or tobacco, and drug use. Access to firearms. Diet, exercise, and sleep habits. Work and work environment. Sunscreen use. Safety issues such as seatbelt and bike helmet use. Physical exam Your health care provider will check your: Height and weight. These may be used to calculate your BMI (body mass index). BMI is a measurement that tells if you are at a healthy weight. Waist circumference. This measures the distance around your waistline. This measurement also tells if you are at a healthy weight and may help predict your risk of certain diseases, such as type 2 diabetes and high blood pressure. Heart rate and blood pressure. Body temperature. Skin for abnormal spots. What immunizations do I need?  Vaccines are usually given at various ages, according to a schedule. Your health care provider will recommend vaccines for you based on your age, medical history, and lifestyle or other factors, such as travel or where you work. What tests do I need? Screening Your health care provider may recommend screening tests for certain conditions. This may include: Lipid and cholesterol levels. Diabetes screening. This is done by checking your blood sugar (glucose) after you have not eaten for a while (fasting). Pelvic exam and Pap test. Hepatitis B test. Hepatitis C  test. HIV (human immunodeficiency virus) test. STI (sexually transmitted infection) testing, if you are at risk. Lung cancer screening. Colorectal cancer screening. Mammogram. Talk with your health care provider about when you should start having regular mammograms. This may depend on whether you have a family history of breast cancer. BRCA-related cancer screening. This may be done if you have a family history of breast, ovarian, tubal, or peritoneal cancers. Bone density scan. This is done to screen for osteoporosis. Talk with your health care provider about your test results, treatment options, and if necessary, the need for more tests. Follow these instructions at home: Eating and drinking  Eat a diet that includes fresh fruits and vegetables, whole grains, lean protein, and low-fat dairy products. Take vitamin and mineral supplements as recommended by your health care provider. Do not drink alcohol if: Your health care provider tells you not to drink. You are pregnant, may be pregnant, or are planning to become pregnant. If you drink alcohol: Limit how much you have to 0-1 drink a day. Know how much alcohol is in your drink. In the [place] one drink equals one 12 oz bottle of beer (355 mL), one 5 oz glass of wine (148 mL), or one 1 oz glass of hard liquor (44 mL). Lifestyle Brush your teeth every morning and night with fluoride toothpaste. Floss one time each day. Exercise for at least 30 minutes 5 or more days each week. Do not use any products that contain nicotine or tobacco. These products include cigarettes, chewing tobacco, and vaping devices, such as e-cigarettes. If you need help quitting, ask your health care provider. Do not use drugs. If you are sexually active, practice safe sex. Use a condom or other form of protection to   prevent STIs. If you do not wish to become pregnant, use a form of birth control. If you plan to become pregnant, see your health care provider for a  prepregnancy visit. Take aspirin only as told by your health care provider. Make sure that you understand how much to take and what form to take. Work with your health care provider to find out whether it is safe and beneficial for you to take aspirin daily. Find healthy ways to manage stress, such as: Meditation, yoga, or listening to music. Journaling. Talking to a trusted person. Spending time with friends and family. Minimize exposure to UV radiation to reduce your risk of skin cancer. Safety Always wear your seat belt while driving or riding in a vehicle. Do not drive: If you have been drinking alcohol. Do not ride with someone who has been drinking. When you are tired or distracted. While texting. If you have been using any mind-altering substances or drugs. Wear a helmet and other protective equipment during sports activities. If you have firearms in your house, make sure you follow all gun safety procedures. Seek help if you have been physically or sexually abused. What's next? Visit your health care provider once a year for an annual wellness visit. Ask your health care provider how often you should have your eyes and teeth checked. Stay up to date on all vaccines. This information is not intended to replace advice given to you by your health care provider. Make sure you discuss any questions you have with your health care provider. Document Revised: 01/17/2021 Document Reviewed: 01/17/2021 Elsevier Patient Education  [place].

## 2022-08-30 LAB — CERVICOVAGINAL ANCILLARY ONLY
Bacterial Vaginitis (gardnerella): NEGATIVE
Candida Glabrata: NEGATIVE
Candida Vaginitis: NEGATIVE
Comment: NEGATIVE
Comment: NEGATIVE
Comment: NEGATIVE

## 2022-11-05 ENCOUNTER — Ambulatory Visit
Admission: RE | Admit: 2022-11-05 | Discharge: 2022-11-05 | Disposition: A | Discharge status: Home / Self Care | Payer: BC Managed Care – PPO | Source: Ambulatory Visit | Attending: Certified Nurse Midwife | Admitting: Certified Nurse Midwife

## 2022-11-05 DIAGNOSIS — Z1231 Encounter for screening mammogram for malignant neoplasm of breast: Secondary | ICD-10-CM

## 2022-11-05 DIAGNOSIS — Z01419 Encounter for gynecological examination (general) (routine) without abnormal findings: Secondary | ICD-10-CM

## 2023-02-03 ENCOUNTER — Encounter (HOSPITAL_COMMUNITY): Payer: Self-pay | Admitting: *Deleted

## 2023-09-16 ENCOUNTER — Other Ambulatory Visit: Payer: Self-pay | Admitting: Podiatry

## 2023-09-16 DIAGNOSIS — S92211A Displaced fracture of cuboid bone of right foot, initial encounter for closed fracture: Secondary | ICD-10-CM

## 2023-09-17 ENCOUNTER — Ambulatory Visit
Admission: RE | Admit: 2023-09-17 | Discharge: 2023-09-17 | Disposition: A | Discharge status: Home / Self Care | Payer: 59 | Source: Ambulatory Visit | Attending: Podiatry | Admitting: Podiatry

## 2023-09-17 ENCOUNTER — Other Ambulatory Visit: Payer: Self-pay | Admitting: Podiatry

## 2023-09-17 DIAGNOSIS — S92211A Displaced fracture of cuboid bone of right foot, initial encounter for closed fracture: Secondary | ICD-10-CM | POA: Insufficient documentation

## 2023-09-18 ENCOUNTER — Encounter: Payer: Self-pay | Admitting: Podiatry

## 2023-09-22 NOTE — Discharge Instructions (Signed)
 [facility]  POST OPERATIVE INSTRUCTIONS FOR DR. Ether Griffins AND DR. BAKER [facility] PODIATRY DEPARTMENT   Take your medication as prescribed.  Pain medication should be taken only as needed.  Keep the dressing clean, dry and intact.  Keep your foot elevated above the heart level for the first 48 hours.  Walking to the bathroom and brief periods of walking are acceptable, unless we have instructed you to be non-weight bearing.  Always wear your post-op shoe when walking.  Always use your crutches if you are to be non-weight bearing.  Do not take a shower. Baths are permissible as long as the foot is kept out of the water.   Every hour you are awake:  Bend your knee 15 times. Flex foot 15 times Massage calf 15 times  Call [facility] [phone]) if any of the following problems occur: You develop a temperature or fever. The bandage becomes saturated with blood. Medication does not stop your pain. Injury of the foot occurs. Any symptoms of infection including redness, odor, or red streaks running from wound.

## 2023-09-24 ENCOUNTER — Encounter: Payer: Self-pay | Admitting: Podiatry

## 2023-09-24 NOTE — Anesthesia Preprocedure Evaluation (Signed)
Anesthesia Evaluation  Patient identified by MRN, date of birth, ID band Patient awake    Reviewed: Allergy & Precautions, H&P , NPO status , Patient's Chart, lab work & pertinent test results  Airway Mallampati: III  TM Distance: <3 FB Neck ROM: Full  Mouth opening: Limited Mouth Opening  Dental no notable dental hx.    Pulmonary neg pulmonary ROS, asthma , sleep apnea , COPD, Current Smoker and Patient abstained from smoking.   Pulmonary exam normal breath sounds clear to auscultation       Cardiovascular hypertension, negative cardio ROS Normal cardiovascular exam Rhythm:Regular Rate:Normal     Neuro/Psych  Headaches PSYCHIATRIC DISORDERS  Depression    negative neurological ROS  negative psych ROS   GI/Hepatic negative GI ROS, Neg liver ROS,GERD  ,,Note S/P gastric sleeve procedure    Endo/Other  negative endocrine ROSHypothyroidism    Renal/GU negative Renal ROS  negative genitourinary   Musculoskeletal negative musculoskeletal ROS (+) Arthritis ,    Abdominal   Peds negative pediatric ROS (+)  Hematology negative hematology ROS (+)   Anesthesia Other Findings Depression  Migraines GERD (gastroesophageal reflux disease) Allergy Hypertension  Thyroid disease Positive TB test  COPD (chronic obstructive pulmonary disease) (HCC) Lupus Sjoegren syndrome Sleep apnea  Hypothyroidism Arthritis  Hematuria Asthma  Rheumatoid arthritis (HCC) Hypothyroidism following radioiodine therapy Hx of migraine headaches S/P gastric sleeve procedure     Reproductive/Obstetrics negative OB ROS                             Anesthesia Physical Anesthesia Plan  ASA: 3  Anesthesia Plan: General ETT   Post-op Pain Management:    Induction: Intravenous  PONV Risk Score and Plan:   Airway Management Planned: Oral ETT  Additional Equipment:   Intra-op Plan:   Post-operative Plan:  Extubation in OR  Informed Consent: I have reviewed the patients History and Physical, chart, labs and discussed the procedure including the risks, benefits and alternatives for the proposed anesthesia with the patient or authorized representative who has indicated his/her understanding and acceptance.     Dental Advisory Given  Plan Discussed with: Anesthesiologist, CRNA and Surgeon  Anesthesia Plan Comments: (Patient consented for risks of anesthesia including but not limited to:  - adverse reactions to medications - damage to eyes, teeth, lips or other oral mucosa - nerve damage due to positioning  - sore throat or hoarseness - Damage to heart, brain, nerves, lungs, other parts of body or loss of life  Patient voiced understanding and assent.)       Anesthesia Quick Evaluation

## 2023-09-25 ENCOUNTER — Ambulatory Visit: Payer: 59 | Admitting: Anesthesiology

## 2023-09-25 ENCOUNTER — Encounter: Payer: Self-pay | Admitting: Podiatry

## 2023-09-25 ENCOUNTER — Ambulatory Visit: Payer: Self-pay

## 2023-09-25 ENCOUNTER — Encounter
Admission: RE | Disposition: A | Discharge status: Home / Self Care | Payer: Self-pay | Source: Home / Self Care | Attending: Podiatry

## 2023-09-25 ENCOUNTER — Ambulatory Visit
Admission: RE | Admit: 2023-09-25 | Discharge: 2023-09-25 | Disposition: A | Discharge status: Home / Self Care | Payer: 59 | Attending: Podiatry | Admitting: Podiatry

## 2023-09-25 ENCOUNTER — Other Ambulatory Visit: Payer: Self-pay

## 2023-09-25 DIAGNOSIS — S92211A Displaced fracture of cuboid bone of right foot, initial encounter for closed fracture: Secondary | ICD-10-CM | POA: Diagnosis not present

## 2023-09-25 DIAGNOSIS — M21961 Unspecified acquired deformity of right lower leg: Secondary | ICD-10-CM | POA: Insufficient documentation

## 2023-09-25 DIAGNOSIS — S92211D Displaced fracture of cuboid bone of right foot, subsequent encounter for fracture with routine healing: Secondary | ICD-10-CM

## 2023-09-25 DIAGNOSIS — M65971 Unspecified synovitis and tenosynovitis, right ankle and foot: Secondary | ICD-10-CM | POA: Diagnosis present

## 2023-09-25 DIAGNOSIS — M24071 Loose body in right ankle: Secondary | ICD-10-CM | POA: Insufficient documentation

## 2023-09-25 DIAGNOSIS — S86311A Strain of muscle(s) and tendon(s) of peroneal muscle group at lower leg level, right leg, initial encounter: Secondary | ICD-10-CM | POA: Diagnosis not present

## 2023-09-25 DIAGNOSIS — X58XXXA Exposure to other specified factors, initial encounter: Secondary | ICD-10-CM | POA: Diagnosis not present

## 2023-09-25 HISTORY — DX: Rheumatoid arthritis, unspecified: M06.9

## 2023-09-25 HISTORY — DX: Acquired absence of stomach (part of): Z90.3

## 2023-09-25 HISTORY — PX: ANKLE ARTHROSCOPY WITH RECONSTRUCTION: SHX5583

## 2023-09-25 HISTORY — PX: ORIF ANKLE FRACTURE: SHX5408

## 2023-09-25 HISTORY — DX: Postprocedural hypothyroidism: E89.0

## 2023-09-25 HISTORY — DX: Personal history of other diseases of the nervous system and sense organs: Z86.69

## 2023-09-25 HISTORY — PX: TENDON REPAIR: SHX5111

## 2023-09-25 SURGERY — ARTHROSCOPY, ANKLE, WITH RECONSTRUCTION
Anesthesia: General | Site: Ankle | Laterality: Right

## 2023-09-25 MED ORDER — FENTANYL CITRATE (PF) 100 MCG/2ML IJ SOLN
100.0000 ug | Freq: Once | INTRAMUSCULAR | Status: AC
  Administered 2023-09-25 (×2): 50 ug via INTRAVENOUS

## 2023-09-25 MED ORDER — SUCCINYLCHOLINE CHLORIDE 200 MG/10ML IV SOSY
PREFILLED_SYRINGE | INTRAVENOUS | Status: DC | PRN
  Administered 2023-09-25: 140 mg via INTRAVENOUS

## 2023-09-25 MED ORDER — FENTANYL CITRATE (PF) 100 MCG/2ML IJ SOLN
INTRAMUSCULAR | Status: DC | PRN
  Administered 2023-09-25 (×4): 25 ug via INTRAVENOUS

## 2023-09-25 MED ORDER — PROPOFOL 10 MG/ML IV BOLUS
INTRAVENOUS | Status: DC | PRN
  Administered 2023-09-25: 150 mg via INTRAVENOUS

## 2023-09-25 MED ORDER — BUPIVACAINE LIPOSOME 1.3 % IJ SUSP
INTRAMUSCULAR | Status: DC | PRN
  Administered 2023-09-25 (×2): 10 mL

## 2023-09-25 MED ORDER — CEFAZOLIN SODIUM-DEXTROSE 2-3 GM-%(50ML) IV SOLR
INTRAVENOUS | Status: AC
  Filled 2023-09-25: qty 50

## 2023-09-25 MED ORDER — MIDAZOLAM HCL 2 MG/2ML IJ SOLN
INTRAMUSCULAR | Status: AC
  Filled 2023-09-25: qty 2

## 2023-09-25 MED ORDER — OXYCODONE-ACETAMINOPHEN 5-325 MG PO TABS: 1.0000 | ORAL_TABLET | Freq: Four times a day (QID) | ORAL | 0 refills | Status: AC | PRN

## 2023-09-25 MED ORDER — ONDANSETRON HCL 4 MG/2ML IJ SOLN
INTRAMUSCULAR | Status: AC
  Filled 2023-09-25: qty 2

## 2023-09-25 MED ORDER — SUCCINYLCHOLINE CHLORIDE 200 MG/10ML IV SOSY
PREFILLED_SYRINGE | INTRAVENOUS | Status: AC
  Filled 2023-09-25: qty 10

## 2023-09-25 MED ORDER — DEXAMETHASONE SODIUM PHOSPHATE 4 MG/ML IJ SOLN
INTRAMUSCULAR | Status: AC
  Filled 2023-09-25: qty 1

## 2023-09-25 MED ORDER — SEVOFLURANE IN SOLN
RESPIRATORY_TRACT | Status: AC
Start: 2023-09-25 — End: ?
  Filled 2023-09-25: qty 250

## 2023-09-25 MED ORDER — MIDAZOLAM HCL 2 MG/2ML IJ SOLN
2.0000 mg | Freq: Once | INTRAMUSCULAR | Status: AC
  Administered 2023-09-25: 2 mg via INTRAVENOUS

## 2023-09-25 MED ORDER — DEXMEDETOMIDINE HCL IN NACL 200 MCG/50ML IV SOLN
INTRAVENOUS | Status: DC | PRN
  Administered 2023-09-25: 4 ug via INTRAVENOUS
  Administered 2023-09-25: 8 ug via INTRAVENOUS
  Administered 2023-09-25 (×2): 4 ug via INTRAVENOUS

## 2023-09-25 MED ORDER — OXYCODONE HCL 5 MG PO TABS
ORAL_TABLET | ORAL | Status: AC
  Filled 2023-09-25: qty 2

## 2023-09-25 MED ORDER — BUPIVACAINE HCL (PF) 0.25 % IJ SOLN
INTRAMUSCULAR | Status: DC | PRN
  Administered 2023-09-25: 10 mL via PERINEURAL

## 2023-09-25 MED ORDER — DEXAMETHASONE SODIUM PHOSPHATE 10 MG/ML IJ SOLN
INTRAMUSCULAR | Status: AC
  Filled 2023-09-25: qty 1

## 2023-09-25 MED ORDER — HYDROMORPHONE HCL 1 MG/ML IJ SOLN: 0.5000 mg | INTRAMUSCULAR | Status: DC | PRN

## 2023-09-25 MED ORDER — FENTANYL CITRATE (PF) 100 MCG/2ML IJ SOLN
INTRAMUSCULAR | Status: AC
  Filled 2023-09-25: qty 2

## 2023-09-25 MED ORDER — BUPIVACAINE HCL (PF) 0.25 % IJ SOLN
INTRAMUSCULAR | Status: DC | PRN
  Administered 2023-09-25: 10 mL via EPIDURAL

## 2023-09-25 MED ORDER — 0.9 % SODIUM CHLORIDE (POUR BTL) OPTIME
TOPICAL | Status: DC | PRN
  Administered 2023-09-25: 1000 mL

## 2023-09-25 MED ORDER — ONDANSETRON HCL 4 MG PO TABS: 4.0000 mg | ORAL_TABLET | Freq: Three times a day (TID) | ORAL | 0 refills | Status: DC | PRN

## 2023-09-25 MED ORDER — OXYCODONE HCL 5 MG PO TABS
10.0000 mg | ORAL_TABLET | Freq: Once | ORAL | Status: AC
  Administered 2023-09-25: 10 mg via ORAL

## 2023-09-25 MED ORDER — LIDOCAINE HCL (CARDIAC) PF 100 MG/5ML IV SOSY
PREFILLED_SYRINGE | INTRAVENOUS | Status: DC | PRN
  Administered 2023-09-25: 100 mg via INTRAVENOUS

## 2023-09-25 MED ORDER — LIDOCAINE HCL (PF) 2 % IJ SOLN
INTRAMUSCULAR | Status: AC
  Filled 2023-09-25: qty 5

## 2023-09-25 MED ORDER — FENTANYL CITRATE (PF) 100 MCG/2ML IJ SOLN
INTRAMUSCULAR | Status: AC
Start: 2023-09-25 — End: ?
  Filled 2023-09-25: qty 2

## 2023-09-25 MED ORDER — CEFAZOLIN SODIUM-DEXTROSE 2-4 GM/100ML-% IV SOLN
2.0000 g | INTRAVENOUS | Status: AC
  Administered 2023-09-25: 2 g via INTRAVENOUS

## 2023-09-25 MED ORDER — BUPIVACAINE HCL 0.25 % IJ SOLN
INTRAMUSCULAR | Status: AC
  Filled 2023-09-25: qty 1

## 2023-09-25 MED ORDER — ONDANSETRON HCL 4 MG/2ML IJ SOLN
INTRAMUSCULAR | Status: DC | PRN
  Administered 2023-09-25: 4 mg via INTRAVENOUS

## 2023-09-25 MED ORDER — ACETAMINOPHEN 10 MG/ML IV SOLN
INTRAVENOUS | Status: DC | PRN
  Administered 2023-09-25: 1000 mg via INTRAVENOUS

## 2023-09-25 MED ORDER — BUPIVACAINE LIPOSOME 1.3 % IJ SUSP
INTRAMUSCULAR | Status: AC
  Filled 2023-09-25: qty 20

## 2023-09-25 MED ORDER — SODIUM CHLORIDE 0.9 % IV SOLN: INTRAVENOUS | Status: DC

## 2023-09-25 MED ORDER — ASPIRIN 81 MG PO TBEC: 81.0000 mg | DELAYED_RELEASE_TABLET | Freq: Two times a day (BID) | ORAL | 0 refills | Status: DC

## 2023-09-25 MED ORDER — AMOXICILLIN-POT CLAVULANATE 875-125 MG PO TABS: 1.0000 | ORAL_TABLET | Freq: Two times a day (BID) | ORAL | 0 refills | Status: AC

## 2023-09-25 MED ORDER — FENTANYL CITRATE PF 50 MCG/ML IJ SOSY
50.0000 ug | PREFILLED_SYRINGE | INTRAMUSCULAR | Status: DC | PRN
  Administered 2023-09-25: 50 ug via INTRAVENOUS

## 2023-09-25 MED ORDER — DEXAMETHASONE SODIUM PHOSPHATE 10 MG/ML IJ SOLN
INTRAMUSCULAR | Status: DC | PRN
Start: 2023-09-25 — End: 2023-09-25
  Administered 2023-09-25: 10 mg via INTRAVENOUS
  Administered 2023-09-25: 4 mg via INTRAVENOUS

## 2023-09-25 MED ORDER — ACETAMINOPHEN 10 MG/ML IV SOLN
INTRAVENOUS | Status: AC
Start: 2023-09-25 — End: ?
  Filled 2023-09-25: qty 100

## 2023-09-25 SURGICAL SUPPLY — 38 items
BAG SNAP BAND KOVER 36X36 (MISCELLANEOUS) ×2 IMPLANT
BASIN GRAD PLASTIC 32OZ STRL (MISCELLANEOUS) IMPLANT
BIT DRILL 100X1.3XAO CNCT (BIT) IMPLANT
BIT DRL 100X1.3XAO CNCT (BIT) ×2
BNDG ELASTIC 4X5.8 VLCR NS LF (GAUZE/BANDAGES/DRESSINGS) ×2 IMPLANT
BNDG ELASTIC 6X5.8 VLCR NS LF (GAUZE/BANDAGES/DRESSINGS) ×2 IMPLANT
BNDG ESMARK 4X12 TAN STRL LF (GAUZE/BANDAGES/DRESSINGS) ×2 IMPLANT
CANISTER SUCT 1200ML W/VALVE (MISCELLANEOUS) ×2 IMPLANT
COVER LIGHT HANDLE UNIVERSAL (MISCELLANEOUS) ×4 IMPLANT
DURAPREP 26ML APPLICATOR (WOUND CARE) ×2 IMPLANT
ELECT REM PT RETURN 9FT ADLT (ELECTROSURGICAL) ×2
ELECTRODE REM PT RTRN 9FT ADLT (ELECTROSURGICAL) ×2 IMPLANT
GAUZE SPONGE 4X4 12PLY STRL (GAUZE/BANDAGES/DRESSINGS) ×2 IMPLANT
GAUZE XEROFORM 1X8 LF (GAUZE/BANDAGES/DRESSINGS) ×2 IMPLANT
GLOVE BIOGEL PI IND STRL 7.5 (GLOVE) ×2 IMPLANT
GLOVE SURG SS PI 7.0 STRL IVOR (GLOVE) ×2 IMPLANT
GOWN STRL REUS W/ TWL LRG LVL3 (GOWN DISPOSABLE) ×4 IMPLANT
HEMOSTAT SURGICEL 2X3 (HEMOSTASIS) IMPLANT
KIT TURNOVER KIT A (KITS) ×2 IMPLANT
NS IRRIG 500ML POUR BTL (IV SOLUTION) ×2 IMPLANT
PACK EXTREMITY ARMC (MISCELLANEOUS) ×2 IMPLANT
PADDING CAST BLEND 4X4 NS (MISCELLANEOUS) ×6 IMPLANT
PLATE MED GOR CUBOID FOOT 7H (Plate) IMPLANT
SCREW LOCK 16X2X BB GORILLA (Screw) IMPLANT
SCREW LOCK BG TI 2.5X22 (Screw) IMPLANT
SCREW LOCK GORILLA ANK 2X22 (Screw) IMPLANT
SCREW LOCKING 2.0X20 (Screw) IMPLANT
SPLINT CAST 1 STEP 4X30 (MISCELLANEOUS) ×2 IMPLANT
SPONGE T-LAP 18X18 ~~LOC~~+RFID (SPONGE) ×2 IMPLANT
STOCKINETTE IMPERVIOUS LG (DRAPES) ×2 IMPLANT
SUT ETHILON 3-0 (SUTURE) IMPLANT
SUT VIC AB 2-0 SH 27XBRD (SUTURE) IMPLANT
SUT VIC AB 3-0 SH 27X BRD (SUTURE) IMPLANT
SUT VIC AB 4-0 SH 27XANBCTRL (SUTURE) IMPLANT
TUBING CONNECTING 10 (TUBING) IMPLANT
WAND TOPAZ MICRO DEBRIDER (MISCELLANEOUS) IMPLANT
WIRE OLIVE SMOOTH 1.3 (WIRE) IMPLANT
WIRE SMOOTH TROCAR .9MMX150MML (WIRE) IMPLANT

## 2023-09-25 NOTE — Anesthesia Postprocedure Evaluation (Signed)
Anesthesia Post Note  Patient: [name]  Procedure(s) Performed: ANKLE ARTHROSCOPY WITH RECONSTRUCTION (Right: Ankle) 88416 - NAVICULAR CUBOID CUNEIFORM (Right: Ankle) 60630 - ZSWFUX TENDON REPAIR - SECOND - PERONEAL TENDON REPAIR (Right)  Patient location during evaluation: PACU Anesthesia Type: General Level of consciousness: awake and alert Pain management: pain level controlled Vital Signs Assessment: post-procedure vital signs reviewed and stable Respiratory status: spontaneous breathing, nonlabored ventilation, respiratory function stable and patient connected to nasal cannula oxygen Cardiovascular status: blood pressure returned to baseline and stable Postop Assessment: no apparent nausea or vomiting Anesthetic complications: no   No notable events documented.   Last Vitals:  Vitals:   09/25/23 1218 09/25/23 1230  BP: (!) 98/44 (!) 102/47  Pulse: 75 73  Resp: 15 17  Temp:  36.6 C  SpO2: 96% 96%    Last Pain:  Vitals:   09/25/23 1230  TempSrc:   PainSc: 4                  Albertine Lafoy C Chelsei Mcchesney

## 2023-09-25 NOTE — H&P (Signed)
HISTORY AND PHYSICAL INTERVAL NOTE:  09/25/2023  8:11 AM  [name]  has presented today for surgery, with the diagnosis of S92.211A - Closed displaced fracture of cuboid of right foot S93.401A - Inversion sprain of right ankle M79.671 - Right foot pain M25.571 - Acute right ankle pain M24.071 - Loose body of right ankle M76.71 - Peroneal tendonitis; right The various methods of treatment have been discussed with the patient.  No guarantees were given.  After consideration of risks, benefits and other options for treatment, the patient has consented to surgery.  I have reviewed the patients' chart and labs.    PROCEDURE: ALL RIGHT FOOT/ANKLE CUBOID FRACTURE ORIF PERONEAL TENDON REPAIR ANKLE ARTHROSCOPY WITH EXTENSIVE DEBRIDEMENT, REMOVAL OF LOOSE BODY USE OF INTRA-OP FLUOROSCOPY  A history and physical examination was performed in my office.  The patient was reexamined.  There have been no changes to this history and physical examination.  Rosetta Posner, DPM

## 2023-09-25 NOTE — Transfer of Care (Signed)
Immediate Anesthesia Transfer of Care Note  Patient: [name]  Procedure(s) Performed: ANKLE ARTHROSCOPY WITH RECONSTRUCTION (Right: Ankle) 96295 - NAVICULAR CUBOID CUNEIFORM (Right: Ankle) 28413 - KGMWNU TENDON REPAIR - SECOND - PERONEAL TENDON REPAIR (Right)  Patient Location: PACU  Anesthesia Type: General ETT  Level of Consciousness: awake, alert  and patient cooperative  Airway and Oxygen Therapy: Patient Spontanous Breathing and Patient connected to supplemental oxygen  Post-op Assessment: Post-op Vital signs reviewed, Patient's Cardiovascular Status Stable, Respiratory Function Stable, Patent Airway and No signs of Nausea or vomiting  Post-op Vital Signs: Reviewed and stable  Complications: No notable events documented.

## 2023-09-25 NOTE — Anesthesia Procedure Notes (Signed)
Procedure Name: Intubation Date/Time: 09/25/2023 9:18 AM  Performed by: Barbette Hair, CRNAPre-anesthesia Checklist: Patient identified, Emergency Drugs available, Suction available, Patient being monitored and Timeout performed Patient Re-evaluated:Patient Re-evaluated prior to induction Oxygen Delivery Method: Circle system utilized Preoxygenation: Pre-oxygenation with 100% oxygen Induction Type: IV induction Laryngoscope Size: Mac and 4 Grade View: Grade I Tube type: Oral Tube size: 7.0 mm Number of attempts: 1 Airway Equipment and Method: Stylet Placement Confirmation: ETT inserted through vocal cords under direct vision, positive ETCO2, CO2 detector and breath sounds checked- equal and bilateral Secured at: 22 cm Tube secured with: Tape

## 2023-09-25 NOTE — Anesthesia Procedure Notes (Signed)
Anesthesia Regional Block: Popliteal block   Pre-Anesthetic Checklist: , timeout performed,  Correct Patient, Correct Site, Correct Laterality,  Correct Procedure, Correct Position, site marked,  Risks and benefits discussed,  Surgical consent,  Pre-op evaluation,  At surgeon's request and post-op pain management  Laterality: Lower and Right  Prep: chloraprep       Needles:  Injection technique: Single-shot  Needle Type: Echogenic Needle     Needle Length: 9cm  Needle Gauge: 21     Additional Needles:   Procedures:,,,, ultrasound used (permanent image in chart),,    Narrative:  Start time: 09/25/2023 8:52 AM End time: 09/25/2023 8:59 AM Injection made incrementally with aspirations every 5 mL.  Performed by: Personally  Anesthesiologist: Marisue Humble, MD  Additional Notes: Patient consented for risk and benefits of nerve block including but not limited to nerve damage, failed block, bleeding and infection.  Patient voiced understanding.  Functioning IV was confirmed and monitors were applied.  Timeout done prior to procedure and prior to any sedation being given to the patient.  Patient confirmed procedure site prior to any sedation given to the patient.  A 50mm 22ga Stimuplex needle was used. Sterile prep,hand hygiene and sterile gloves were used.  Minimal sedation used for procedure.  No paresthesia endorsed by patient during the procedure.  Negative aspiration and negative test dose prior to incremental administration of local anesthetic. The patient tolerated the procedure well with no immediate complications.

## 2023-09-25 NOTE — Op Note (Signed)
PODIATRY / FOOT AND ANKLE SURGERY OPERATIVE REPORT    SURGEON: Rosetta Posner, DPM  PRE-OPERATIVE DIAGNOSIS: All right foot and ankle 1.  Right ankle synovitis with loose body 2.  Right peroneal tendon tenosynovitis with tear of peroneus brevis tendon about the distal fibula 3.  Right comminuted intra-articular displaced fracture of the cuboid  POST-OPERATIVE DIAGNOSIS: Same but in addition 1.  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  Right ankle anterior lateral impingement syndrome  PROCEDURE(S): Right ankle arthroscopy with extensive debridement and removal of loose body Right ankle osteochondral defect repair, microfracturing Right peroneal tendon repair with tenosynovectomy and removal of low-lying muscle belly Right cuboid fracture open reduction with internal fixation  HEMOSTASIS: Right thigh tourniquet  ANESTHESIA: general  ESTIMATED BLOOD LOSS: 20 cc  FINDING(S): 1.  Intra-articular comminuted fracture of the cuboid with mild displacement 2.  Tenosynovitis around the peroneus brevis and longus tendons with associated peroneus brevis tendon split thickness tear about the distal fibula 3.  Osteochondral defect dorsal lateral distal talus 4.  Loose body present within the medial gutter area posteriorly of the ankle 5.  Anterior lateral impingement syndrome ankle  PATHOLOGY/SPECIMEN(S): None  INDICATIONS:   [name] is a 53 y.o. female who presents with ankle pain after sustaining an injury about 3 weeks ago.  Patient went to [facility] where she had a CT scan taken which showed a comminuted fracture of the cuboid.  Patient had other symptoms around the peroneal tendons as well as within the medial ankle area so an MRI was performed after that time showing a tear of the peroneus brevis tendon as well as tenosynovitis around the area, inflammation of the talus, navicular, and cuboid, inflamed accessory navicular.  Patient also appeared to have some  mild cartilage damage within the ankle globally.  Patient came to me for second opinion.  All treatment options were discussed with the patient both conservative and surgical attempts at correction include potential risks and complications at this time patient is elected for surgical procedure described above.  No guarantees given.  Discussed that patient is at increased risk for postoperative complications due to history of smoking.  Patient accepts this risk and would like to proceed with surgery today.  Procedure performed as described below.  DESCRIPTION: After obtaining full informed written consent, the patient was brought back to the operating room and placed supine upon the operating table.  The patient received IV antibiotics prior to induction.  After obtaining adequate anesthesia, the patient was prepped and draped in the standard fashion.  A popliteal nerve block was performed by anesthesia preoperatively.  An Esmarch bandage was used to exsanguinate the right lower extremity the pneumatic thigh tourniquet was inflated.  Attention was then directed to the anterior ankle where the distractor was applied with the arthroscopy set to distract the ankle joint further.  The needle was then introduced through the anterior medial portal medial to the tendon of the tibialis anterior and inserted into the ankle joint.  Approximately 10 cc of normal sterile saline was then insufflated into the ankle.  A small stab incision was then made over this area.  Blunt dissection was continued down to the ankle joint capsule with a hemostat and the ankle joint was punctured and a rush of fluid was seen indicating successful entry to the ankle joint.  The obturator and cannula was then placed into the ankle joint and the obturator was removed leaving the cannula intact and the arthroscopy set  was then applied with the appropriate inflow and outflow.  The ankle was then inspected and noted to have some synovitis about the  anterior lateral ankle as well as central ankle and there did appear to be some fibrous tissue that appeared to be impinging the anterior lateral ankle.  At this area of impingement there did appear to be a mild area of cartilage damage which measured approximately 3 mm x 4 mm, it appeared to be loose and floating within the joint.  The anterior lateral portal was then made with a small stab incision and blunt dissection was continued down to the ankle joint capsule with a hemostat and the ankle joint capsule was punctured thereby exposing the ankle joint and a rush of fluid was seen indicating successful entry.  The shaver was then placed through the anterior lateral portal.  All synovitic tissue at the lateral and anterior ankle was resected.  A grasping cut type instrument was then placed to the anterior lateral portal after the shaver was removed and the impinged tissue was cut.  The shaver was then introduced back again and used to shave this area down further and there did not appear to be any impingement with ankle joint motion after that.  The small area of cartilage damage was then examined further with the probe after the shaver was removed.  It measured approximately 3 mm x 5 mm after full exposure was obtained.  A curette was used to remove a small section of this cartilage to a healthy base.  At this time a 60 degree pick was then placed into the area of the subchondral bone which appeared to be healthy in nature and 3 small holes were made into this area and the globulus fat from the talus was noted to be coming from the areas of these holes indicating adequate depth of microfracturing.  The instrumentation was then switched such that the scope was then placed to the anterior lateral portal and the shaver was placed through the medial portal.  Any synovitic tissue was resected at the medial malleolar level as well as anterior medial ankle.  The ankle was distracted further and the scope was then  placed deeper into the ankle joint.  A loose body was noted to be present at the posterior medial aspect of the ankle joint.  A grasper instrument was then used to grasp that loose body and the bone fragment was resected and removed from the operative site.  The bone fragment measured approximately 1 cm x 0.6 cm.  Appeared to be fairly small in nature.  The remaining surface of the talus and tibia appeared to be clean with no other areas of obvious cartilage damage.  The suction was then applied and the inflow was turned off to exsanguinate the ankle joint and the instrumentation was removed.  The skin was reapproximated well coapted with 3-0 nylon at both incision sites.  Attention was then directed to the dorsal lateral foot where an incision was marked out starting from the interval between the fourth and fifth tarsometatarsal joints extending to the calcaneocuboid joint.  The incision was deepened to the subcutaneous tissues utilizing sharp and blunt dissection and care was taken to identify and retract all vital neurovascular structures all venous contributories were cauterized as necessary.  At this time the deep fascia overlying the extensor brevis muscle belly was incised and the brevis muscle was reflected off of the dorsal surface of the cuboid and calcaneocuboid joint area.  A  periosteal incision was then made into this area and the periosteum was reflected medially and laterally thereby exposing the cuboid.  The fourth and fifth tarsometatarsal joint and calcaneocuboid joint were identified as well but care was taken to try to not dissect the capsule off of these areas much.  The fracture appeared to be somewhat comminuted but fairly small in nature.  It did appear to be intra-articular going into the calcaneocuboid joint but did not appear to go into the fourth or fifth tarsometatarsal joint area.  The fracture was then opened with a curette and cleaned out removing any fibrous debris to the area.   The bone appeared to be fairly hard in this area and there did not appear to be any obvious bony defect so no bone graft was needed to be applied.  The fracture was then held in the reduced position examining the calcaneocuboid joint while doing so.  This appeared to line the joint up well overall and the fracture pieces were in good alignment overall.  This reduction was then held in place with temporary wires.  At this time a Paragon 28 cuboid plate was placed over the dorsal lateral aspect of the cuboid and held into place with olive wire fixation.  C-arm imaging was utilized to verify correct position of plate which appeared to be excellent and appeared to be away from the calcaneocuboid joint and fourth and fifth tarsometatarsal joints.  The plate appeared to sit on the cuboid appropriately as well and did not appear to be touching her in the way of the peroneal tendons.  Five 2.0 locking screws then placed through the cuboid plate with the appropriate orientation.  The lateral screws were placed in such a way to try to go perpendicular to the fracture to obtain compression across the area.  Overall the plate appeared to sit well.  Excellent reduction was noted on C-arm imaging and the plate appeared to be of the appropriate size and length as well as the appropriate size screws.  Alignment appear to be excellent overall.  Attention was then directed to the lateral ankle along the peroneal tendon course.  A longitudinal incision was made over this area extending to the lateral ankle over the peroneus brevis and longus tendons.  Care was taken to identify retract all vital neurovascular structures and all venous contributories were cauterized as necessary.  At this time the sural nerve was identified and retracted plantarly and posteriorly throughout the remainder the case.  A incision was made into the tendon sheath of the peroneus brevis and longus tendons.  The peroneus brevis and longus tendons were  identified.  The peroneus brevis tendon appeared to have a split thickness tear that was approximately 2.5 cm in length and did appear to have a low-lying muscle belly.  The low-lying muscle belly was resected and passed off in the operative site.  The peroneal tendons appeared to glide much better at the posterior lateral ankle after the low muscle belly was resected.  The Topaz micro debridement set was then used to debride the peroneus brevis tendon tear.  The peroneus brevis tendon was then retubularized with 2-0 Vicryl in a running interconnected stitch.  Any synovitic tissue was resected and passed off in the operative site.  Both surgical sites were flushed with copious amounts normal sterile saline.  The extensor digitorum brevis muscle was then reapproximated well coapted with 3-0 Vicryl.  The peroneal tendon sheath and retinaculum was reapproximated well coapted with 3-0 Vicryl.  The subcutaneous tissue was reapproximated well coapted with the combination of 3-0 and 4-0 Vicryl.  The skin was then reapproximated well coapted with 3-0 nylon.  The pneumatic ankle tourniquet was deflated during closure and a prompt hyperemic response was noted all digits of the right foot.    A postoperative dressing was then applied consisting of Xeroform followed by 4 x 4 gauze, ABD, Kerlix, Webril, posterior splint, Ace wrap.  Following a period of postoperative monitoring the patient be discharged home with the appropriate orders, instructions, and medications.  Patient is to remain nonweightbearing at all times the right lower extremity.  Patient is to follow-up in outpatient clinic in 1 week from surgical date.  COMPLICATIONS: None  CONDITION: Good, stable  Rosetta Posner, DPM

## 2023-09-25 NOTE — Anesthesia Procedure Notes (Signed)
Anesthesia Regional Block: Adductor canal block   Pre-Anesthetic Checklist: , timeout performed,  Correct Patient, Correct Site, Correct Laterality,  Correct Procedure, Correct Position, site marked,  Risks and benefits discussed,  Surgical consent,  Pre-op evaluation,  At surgeon's request and post-op pain management  Laterality: Right and Lower  Prep: chloraprep       Needles:  Injection technique: Single-shot  Needle Type: Echogenic Needle     Needle Length: 9cm  Needle Gauge: 21     Additional Needles:   Procedures:,,,, ultrasound used (permanent image in chart),,    Narrative:  Start time: 09/25/2023 8:52 AM End time: 09/25/2023 8:59 AM Injection made incrementally with aspirations every 5 mL.  Performed by: Personally  Anesthesiologist: Marisue Humble, MD  Additional Notes: Functioning IV was confirmed and monitors applied. Ultrasound guidance: relevant anatomy identified, needle position confirmed, local anesthetic spread visualized around nerve(s)., vascular puncture avoided.  Image printed for medical record.  Negative aspiration and no paresthesias; incremental administration of local anesthetic. The patient tolerated the procedure well. Vitals signs recorded in RN notes.

## 2023-10-02 ENCOUNTER — Encounter: Payer: Self-pay | Admitting: Podiatry

## 2023-10-09 ENCOUNTER — Ambulatory Visit: Payer: Self-pay | Admitting: Certified Nurse Midwife

## 2023-11-19 ENCOUNTER — Ambulatory Visit (INDEPENDENT_AMBULATORY_CARE_PROVIDER_SITE_OTHER): Payer: Self-pay | Admitting: Certified Nurse Midwife

## 2023-11-19 ENCOUNTER — Encounter: Payer: Self-pay | Admitting: Certified Nurse Midwife

## 2023-11-19 VITALS — BP 108/71 | HR 78 | Ht 70.0 in | Wt 187.2 lb

## 2023-11-19 DIAGNOSIS — Z01419 Encounter for gynecological examination (general) (routine) without abnormal findings: Secondary | ICD-10-CM | POA: Diagnosis not present

## 2023-11-19 DIAGNOSIS — Z1231 Encounter for screening mammogram for malignant neoplasm of breast: Secondary | ICD-10-CM

## 2023-11-19 MED ORDER — PREMARIN 0.625 MG/GM VA CREA: 0.5000 | TOPICAL_CREAM | Freq: Every day | VAGINAL | 12 refills | Status: AC

## 2023-11-19 NOTE — Patient Instructions (Signed)
 Preventive Care 16-55 Years Old, Female  Preventive care refers to lifestyle choices and visits with your health care provider that can promote health and wellness. Preventive care visits are also called wellness exams.  What can I expect for my preventive care visit?  Counseling  Your health care provider may ask you questions about your:  Medical history, including:  Past medical problems.  Family medical history.  Pregnancy history.  Current health, including:  Menstrual cycle.  Method of birth control.  Emotional well-being.  Home life and relationship well-being.  Sexual activity and sexual health.  Lifestyle, including:  Alcohol, nicotine or tobacco, and drug use.  Access to firearms.  Diet, exercise, and sleep habits.  Work and work Astronomer.  Sunscreen use.  Safety issues such as seatbelt and bike helmet use.  Physical exam  Your health care provider will check your:  Height and weight. These may be used to calculate your BMI (body mass index). BMI is a measurement that tells if you are at a healthy weight.  Waist circumference. This measures the distance around your waistline. This measurement also tells if you are at a healthy weight and may help predict your risk of certain diseases, such as type 2 diabetes and high blood pressure.  Heart rate and blood pressure.  Body temperature.  Skin for abnormal spots.  What immunizations do I need?    Vaccines are usually given at various ages, according to a schedule. Your health care provider will recommend vaccines for you based on your age, medical history, and lifestyle or other factors, such as travel or where you work.  What tests do I need?  Screening  Your health care provider may recommend screening tests for certain conditions. This may include:  Lipid and cholesterol levels.  Diabetes screening. This is done by checking your blood sugar (glucose) after you have not eaten for a while (fasting).  Pelvic exam and Pap test.  Hepatitis B test.  Hepatitis C  test.  HIV (human immunodeficiency virus) test.  STI (sexually transmitted infection) testing, if you are at risk.  Lung cancer screening.  Colorectal cancer screening.  Mammogram. Talk with your health care provider about when you should start having regular mammograms. This may depend on whether you have a family history of breast cancer.  BRCA-related cancer screening. This may be done if you have a family history of breast, ovarian, tubal, or peritoneal cancers.  Bone density scan. This is done to screen for osteoporosis.  Talk with your health care provider about your test results, treatment options, and if necessary, the need for more tests.  Follow these instructions at home:  Eating and drinking    Eat a diet that includes fresh fruits and vegetables, whole grains, lean protein, and low-fat dairy products.  Take vitamin and mineral supplements as recommended by your health care provider.  Do not drink alcohol if:  Your health care provider tells you not to drink.  You are pregnant, may be pregnant, or are planning to become pregnant.  If you drink alcohol:  Limit how much you have to 0-1 drink a day.  Know how much alcohol is in your drink. In the [place] one drink equals one 12 oz bottle of beer (355 mL), one 5 oz glass of wine (148 mL), or one 1 oz glass of hard liquor (44 mL).  Lifestyle  Brush your teeth every morning and night with fluoride toothpaste. Floss one time each day.  Exercise for at least  30 minutes 5 or more days each week.  Do not use any products that contain nicotine or tobacco. These products include cigarettes, chewing tobacco, and vaping devices, such as e-cigarettes. If you need help quitting, ask your health care provider.  Do not use drugs.  If you are sexually active, practice safe sex. Use a condom or other form of protection to prevent STIs.  If you do not wish to become pregnant, use a form of birth control. If you plan to become pregnant, see your health care provider for a  prepregnancy visit.  Take aspirin only as told by your health care provider. Make sure that you understand how much to take and what form to take. Work with your health care provider to find out whether it is safe and beneficial for you to take aspirin daily.  Find healthy ways to manage stress, such as:  Meditation, yoga, or listening to music.  Journaling.  Talking to a trusted person.  Spending time with friends and family.  Minimize exposure to UV radiation to reduce your risk of skin cancer.  Safety  Always wear your seat belt while driving or riding in a vehicle.  Do not drive:  If you have been drinking alcohol. Do not ride with someone who has been drinking.  When you are tired or distracted.  While texting.  If you have been using any mind-altering substances or drugs.  Wear a helmet and other protective equipment during sports activities.  If you have firearms in your house, make sure you follow all gun safety procedures.  Seek help if you have been physically or sexually abused.  What's next?  Visit your health care provider once a year for an annual wellness visit.  Ask your health care provider how often you should have your eyes and teeth checked.  Stay up to date on all vaccines.  This information is not intended to replace advice given to you by your health care provider. Make sure you discuss any questions you have with your health care provider.  Document Revised: 01/17/2021 Document Reviewed: 01/17/2021  Elsevier Patient Education  2024 [employer].

## 2023-11-19 NOTE — Progress Notes (Signed)
 GYNECOLOGY ANNUAL PREVENTATIVE CARE ENCOUNTER NOTE  History:     [name] is a 53 y.o. G69P1011 female here for a routine annual gynecologic exam.  Current complaints: pain with intercourse.   Denies abnormal vaginal bleeding, discharge, pelvic pain, problems with intercourse or other gynecologic concerns.     Social Relationship:married Living:with husband Work:full time Intake Coordinator [employer]  Exercise:not actively, starting weight bearing exercises. Smoke/Alcohol/drug ZOX:WRUEAVW Smoker-only with stress (rare).ccasional drinker/no history of drug use  Gynecologic History No LMP recorded. Patient has had a hysterectomy. Contraception: status post hysterectomy Last mammogram: 11/05/2022. Results were: normal  Obstetric History OB History  Gravida Para Term Preterm AB Living  2 1 1  0 1 1  SAB IAB Ectopic Multiple Live Births  0 1 0 0 1    # Outcome Date GA Lbr Len/2nd Weight Sex Type Anes PTL Lv  2 Term 10/19/85 [redacted]w[redacted]d  7 lb 2 oz (3.232 kg) M Vag-Spont None N LIV  1 IAB             Past Medical History:  Diagnosis Date   Allergy    Arthritis    Rheumatoid Arthritis   Asthma    COPD (chronic obstructive pulmonary disease) (HCC)    Depression    GERD (gastroesophageal reflux disease)    Hematuria    Hx of migraine headaches    Hypertension    Hypothyroidism    Hypothyroidism following radioiodine therapy    Lupus    Migraines    Positive TB test    blood test negative and chest x ray negataive per patient   Rheumatoid arthritis (HCC)    S/P gastric sleeve procedure    Sjoegren syndrome    Sleep apnea    uses CPAP, lost weight no longer uses CPAP   Thyroid disease    Graves disease radioactive iodine 10/12    Past Surgical History:  Procedure Laterality Date   ABDOMINAL HYSTERECTOMY  2013   and BSO   ANKLE ARTHROSCOPY WITH RECONSTRUCTION Right 09/25/2023   Procedure: ANKLE ARTHROSCOPY WITH RECONSTRUCTION;  Surgeon: Rosetta Posner, DPM;   Location: [facility];  Service: Orthopedics/Podiatry;  Laterality: Right;   ANKLE SURGERY  08/06/2007   left x2   BILATERAL SALPINGOOPHORECTOMY  2013   BREAST CYST ASPIRATION Left    neg   BREAST SURGERY Left 2013   biopsy-benign   CHOLECYSTECTOMY  1990   ganglion cyst removal  08/06/2003   left wrist   LAPAROSCOPIC GASTRIC SLEEVE RESECTION N/A 07/05/2019   Procedure: LAPAROSCOPIC GASTRIC SLEEVE RESECTION, Upper Endo, Eras Pathway;  Surgeon: Luretha Murphy, MD;  Location: [facility];  Service: General;  Laterality: N/A;   OOPHORECTOMY     ORIF ANKLE FRACTURE Right 09/25/2023   Procedure: 09811 - NAVICULAR CUBOID CUNEIFORM;  Surgeon: Rosetta Posner, DPM;  Location: [facility];  Service: Orthopedics/Podiatry;  Laterality: Right;   TENDON REPAIR Right 09/25/2023   Procedure: 91478 - GNFAOZ TENDON REPAIR - SECOND - PERONEAL TENDON REPAIR;  Surgeon: Rosetta Posner, DPM;  Location: [facility];  Service: Orthopedics/Podiatry;  Laterality: Right;   TUBAL LIGATION  2000    Current Outpatient Medications on File Prior to Visit  Medication Sig Dispense Refill   acetaminophen (TYLENOL) 325 MG tablet Take 650 mg by mouth every 6 (six) hours as needed for moderate pain or headache.      albuterol (PROVENTIL HFA;VENTOLIN HFA) 108 (90 BASE) MCG/ACT inhaler Inhale 1-2 puffs into the lungs  every 6 (six) hours as needed for wheezing or shortness of breath. 1 Inhaler 4   amoxicillin-clavulanate (AUGMENTIN) 875-125 MG tablet Take 1 tablet by mouth 2 (two) times daily. 14 tablet 0   ARMOUR THYROID 90 MG tablet TAKE 1 TABLET BY MOUTH EVERY DAY ON EMPTY STOMACH FOR 90 DAYS     ASPIRIN 81 PO SMARTSIG:1 Tablet(s) By Mouth Twice Daily     DULoxetine (CYMBALTA) 60 MG capsule Take 60 mg by mouth daily.      Eszopiclone 3 MG TABS Take 3 mg by mouth at bedtime as needed.     fluticasone-salmeterol (ADVAIR HFA) 115-21 MCG/ACT inhaler Inhale 2 puffs into the lungs 2 (two) times daily.     folic  acid (FOLVITE) 1 MG tablet Take 2 mg by mouth daily.     gabapentin (NEURONTIN) 300 MG capsule Take 300 mg by mouth 3 (three) times daily.     hydrOXYzine (VISTARIL) 50 MG capsule Take 50 mg by mouth daily as needed.     ibuprofen (ADVIL) 200 MG tablet Take 800 mg by mouth every 6 (six) hours as needed (pain).      LamoTRIgine 250 MG TB24 24 hour tablet Take 250 mg by mouth every morning.   0   [Paused] methotrexate (RHEUMATREX) 2.5 MG tablet Take 25 mg by mouth once a week.   5   Multiple Vitamins-Minerals (MULTIVITAMIN ADULT PO) Take by mouth. Taking [employer] bariatric multivitamin     pantoprazole (PROTONIX) 40 MG tablet Take 1 tablet (40 mg total) by mouth 2 (two) times daily. 180 tablet 1   pilocarpine (SALAGEN) 5 MG tablet Take by mouth 2 (two) times daily.     rosuvastatin (CRESTOR) 5 MG tablet Take 5 mg by mouth daily.     topiramate (TOPAMAX) 100 MG tablet Take 1 tablet (100 mg total) by mouth 2 (two) times daily. 180 tablet 0   [Paused] Upadacitinib ER (RINVOQ) 15 MG TB24 Take by mouth daily.     Vitamin D, Ergocalciferol, (DRISDOL) 1.25 MG (50000 UNIT) CAPS capsule Take by mouth.     No current facility-administered medications on file prior to visit.    Allergies  Allergen Reactions   Inh [Isoniazid] Swelling   Zithromax [Azithromycin] Hives    Social History:  reports that she has been smoking cigarettes. She started smoking about 30 years ago. She has a 40 pack-year smoking history. She has never used smokeless tobacco. She reports that she does not currently use alcohol. She reports that she does not use drugs.  Family History  Problem Relation Age of Onset   Cancer Mother 18       fallopian tube   Arthritis Mother    Heart disease Mother    Diabetes Mother    Hypertension Mother    Coronary artery disease Father    Cancer Maternal Aunt        Breast CA   Diabetes Maternal Grandmother    Diabetes Maternal Grandfather    Diabetes Paternal Grandmother    Diabetes  Paternal Grandfather    Breast cancer Paternal Aunt    Breast cancer Paternal Aunt    Breast cancer Paternal Aunt     The following portions of the patient's history were reviewed and updated as appropriate: allergies, current medications, past family history, past medical history, past social history, past surgical history and problem list.  Review of Systems Pertinent items noted in HPI and remainder of comprehensive ROS otherwise negative.  Physical Exam:  BP 108/71   Pulse 78   Ht 5\' 10"  (1.778 m)   Wt 187 lb 3.2 oz (84.9 kg)   BMI 26.86 kg/m  CONSTITUTIONAL: Well-developed, well-nourished female in no acute distress.  HENT:  Normocephalic, atraumatic, External right and left ear normal. Oropharynx is clear and moist EYES: Conjunctivae and EOM are normal. Pupils are equal, round, and reactive to light. No scleral icterus.  NECK: Normal range of motion, supple, no masses.  Normal thyroid.  SKIN: Skin is warm and dry. No rash noted. Not diaphoretic. No erythema. No pallor. MUSCULOSKELETAL: Normal range of motion. No tenderness.  No cyanosis, clubbing, or edema.  2+ distal pulses. NEUROLOGIC: Alert and oriented to person, place, and time. Normal reflexes, muscle tone coordination.  PSYCHIATRIC: Normal mood and affect. Normal behavior. Normal judgment and thought content. CARDIOVASCULAR: Normal heart rate noted, regular rhythm RESPIRATORY: Clear to auscultation bilaterally. Effort and breath sounds normal, no problems with respiration noted. BREASTS: Symmetric in size. No masses, tenderness, skin changes, nipple drainage, or lymphadenopathy bilaterally.  ABDOMEN: Soft, no distention noted.  No tenderness, rebound or guarding.  PELVIC: Normal appearing external genitalia and urethral meatus; normal appearing vaginal mucosa and cervix.  No abnormal discharge noted.  Pap smear not indicated ( vaginal cuff)..  Normal uterine size, no other palpable masses, no uterine or adnexal tenderness.   .   Assessment and Plan:   Annual [facility] GYN Exam  Pap: total hysterectomy  Mammogram : ordered Labs: has done with PCP Refills: premarin , reviewed risks and benefits to use  Referral: none Routine preventative health maintenance measures emphasized. Please refer to After Visit Summary for other counseling recommendations.      Alise Appl, CNM [facility]  [facility],  [facility]

## 2024-01-30 ENCOUNTER — Encounter (HOSPITAL_COMMUNITY): Payer: Self-pay | Admitting: *Deleted

## 2024-06-08 ENCOUNTER — Ambulatory Visit

## 2024-07-13 ENCOUNTER — Ambulatory Visit
Admission: RE | Admit: 2024-07-13 | Discharge: 2024-07-13 | Disposition: A | Source: Ambulatory Visit | Attending: Certified Nurse Midwife

## 2024-07-13 DIAGNOSIS — Z1231 Encounter for screening mammogram for malignant neoplasm of breast: Secondary | ICD-10-CM

## 2024-07-13 DIAGNOSIS — Z01419 Encounter for gynecological examination (general) (routine) without abnormal findings: Secondary | ICD-10-CM
